# Patient Record
Sex: Female | Born: 1965 | Race: Black or African American | Hispanic: No | Marital: Single | Smoking: Never smoker
Health system: Southern US, Community
[De-identification: ages and names within clinical notes are randomized; demographics above are authoritative.]

## PROBLEM LIST (undated history)

## (undated) DIAGNOSIS — M1990 Unspecified osteoarthritis, unspecified site: Secondary | ICD-10-CM

## (undated) DIAGNOSIS — E039 Hypothyroidism, unspecified: Secondary | ICD-10-CM

## (undated) DIAGNOSIS — K219 Gastro-esophageal reflux disease without esophagitis: Secondary | ICD-10-CM

## (undated) DIAGNOSIS — J45909 Unspecified asthma, uncomplicated: Secondary | ICD-10-CM

## (undated) DIAGNOSIS — I10 Essential (primary) hypertension: Secondary | ICD-10-CM

## (undated) DIAGNOSIS — F32A Depression, unspecified: Secondary | ICD-10-CM

## (undated) DIAGNOSIS — F419 Anxiety disorder, unspecified: Secondary | ICD-10-CM

## (undated) HISTORY — PX: WRIST SURGERY: SHX841

## (undated) HISTORY — PX: KNEE ARTHROPLASTY: SHX992

## (undated) HISTORY — PX: HERNIA REPAIR: SHX51

## (undated) HISTORY — PX: WISDOM TOOTH EXTRACTION: SHX21

---

## 1998-11-05 ENCOUNTER — Inpatient Hospital Stay (HOSPITAL_COMMUNITY): Admission: AD | Admit: 1998-11-05 | Discharge: 1998-11-05 | Payer: Self-pay | Admitting: Obstetrics

## 1998-11-16 ENCOUNTER — Encounter (HOSPITAL_COMMUNITY): Admission: RE | Admit: 1998-11-16 | Discharge: 1998-11-26 | Payer: Self-pay | Admitting: Obstetrics

## 1998-11-22 ENCOUNTER — Encounter: Payer: Self-pay | Admitting: Obstetrics

## 1998-11-24 ENCOUNTER — Inpatient Hospital Stay (HOSPITAL_COMMUNITY): Admission: AD | Admit: 1998-11-24 | Discharge: 1998-11-27 | Payer: Self-pay | Admitting: Obstetrics

## 1998-11-25 ENCOUNTER — Encounter: Payer: Self-pay | Admitting: Obstetrics

## 2010-01-21 ENCOUNTER — Inpatient Hospital Stay (HOSPITAL_COMMUNITY): Admission: RE | Admit: 2010-01-21 | Discharge: 2010-01-24 | Payer: Self-pay | Admitting: Orthopedic Surgery

## 2010-01-27 ENCOUNTER — Inpatient Hospital Stay (HOSPITAL_COMMUNITY): Admission: EM | Admit: 2010-01-27 | Discharge: 2010-01-28 | Payer: Self-pay | Admitting: Internal Medicine

## 2010-01-28 IMAGING — CT CT EXTREM LOW W/O CM*R*
3 series · 16 of 33 positions shown, 19 images · non-contrast
Comparison: None.

CLINICAL DATA: Status post knee replacement [DATE].  Knee pain
and bruising.  Question hemarthrosis.

CT RIGHT KNEE WITHOUT CONTRAST
TECHNIQUE: Multidetector CT imaging of the right knee was
performed according to the standard protocol without intravenous
contrast. Multiplanar CT image reconstructions were also generated.

[Series 5: rt knee 3.0 b40s · axial · 0.40mm/px · z∈[-344,-164]mm · 8 of 72 slices shown, 10 images]
[im 6/72  soft-tissue]
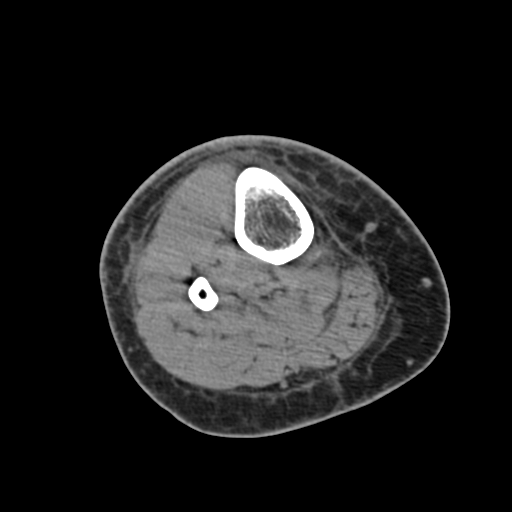
[im 6/72  bone]
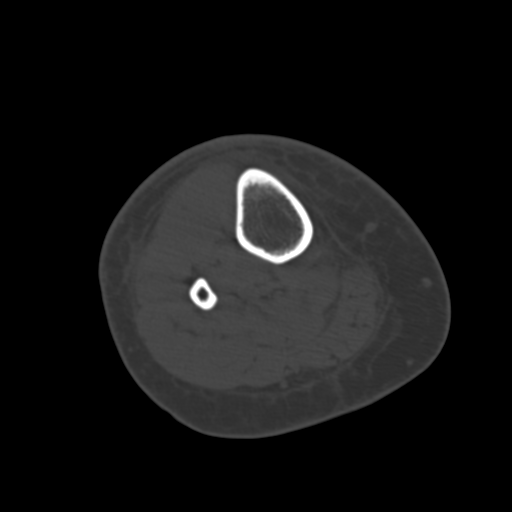
[im 17/72  bone]
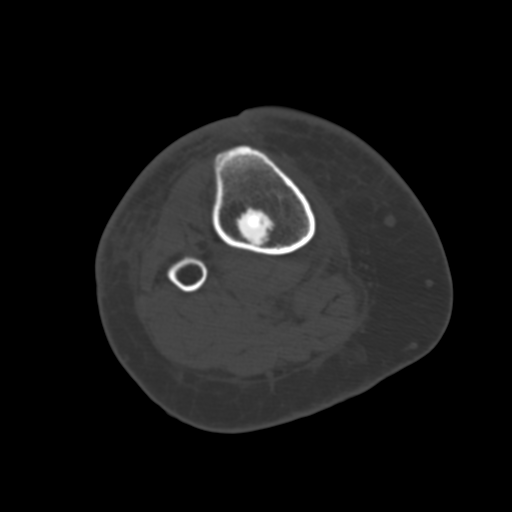
[im 22/72  bone]
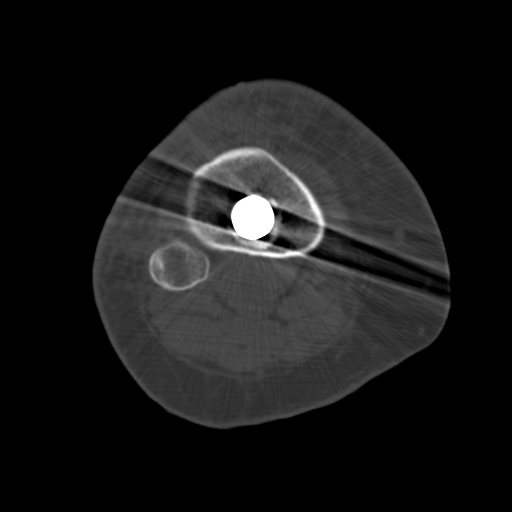
[im 33/72  bone]
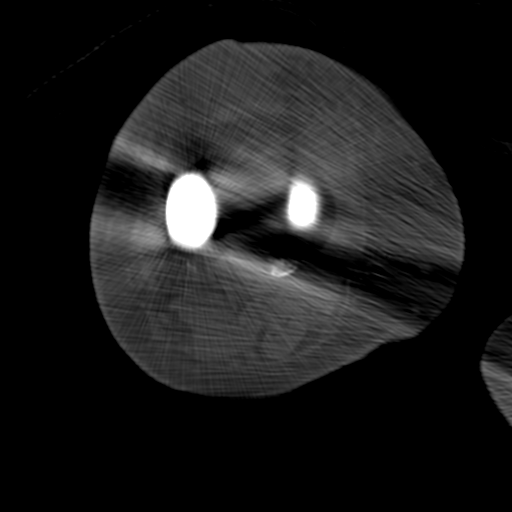
[im 39/72  soft-tissue]
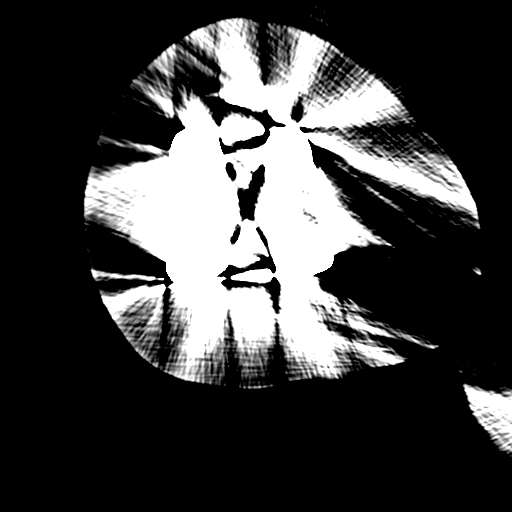
[im 39/72  bone]
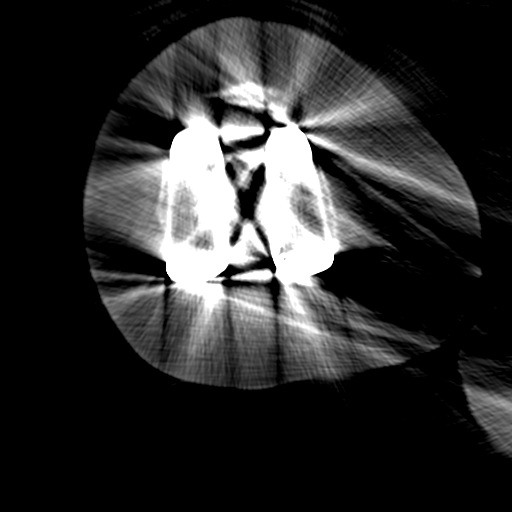
[im 50/72  bone]
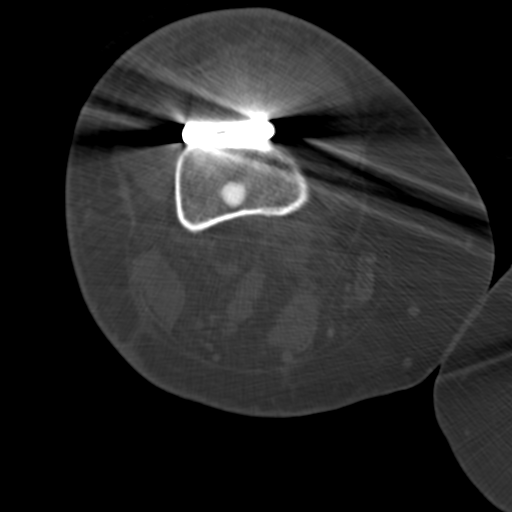
[im 55/72  bone]
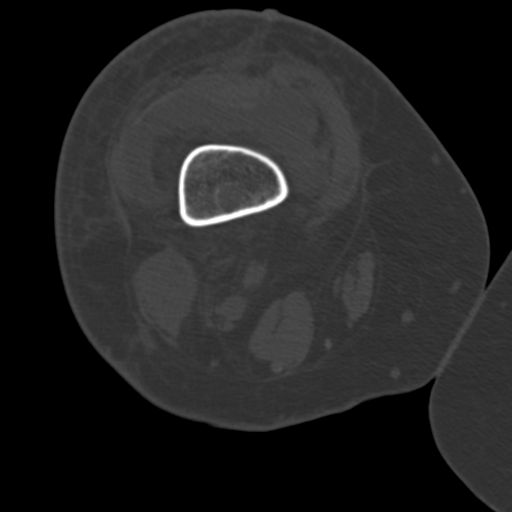
[im 66/72  bone]
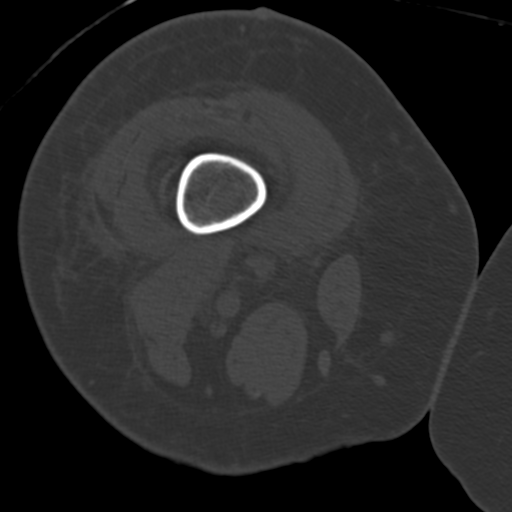

[Series 602: coronal soft tissue · coronal · 0.42mm/px · 3 of 79 slices shown]
[im 16/79  bone]
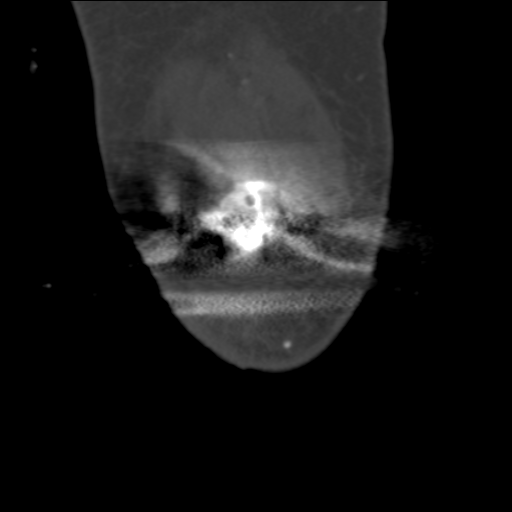
[im 32/79  bone]
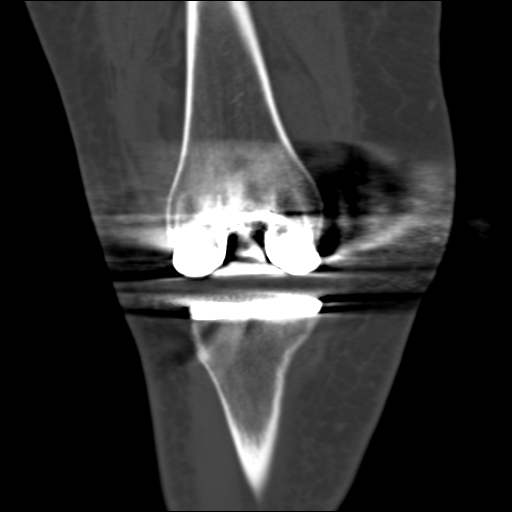
[im 47/79  bone]
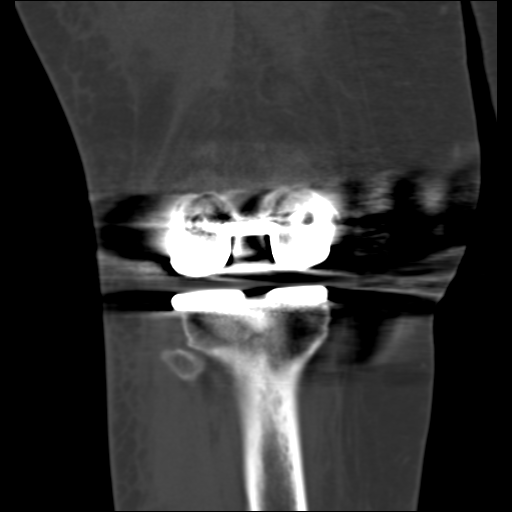

[Series 603: sagittal soft tissue · sagittal · 0.42mm/px · 5 of 94 slices shown, 6 images]
[im 32/94  bone]
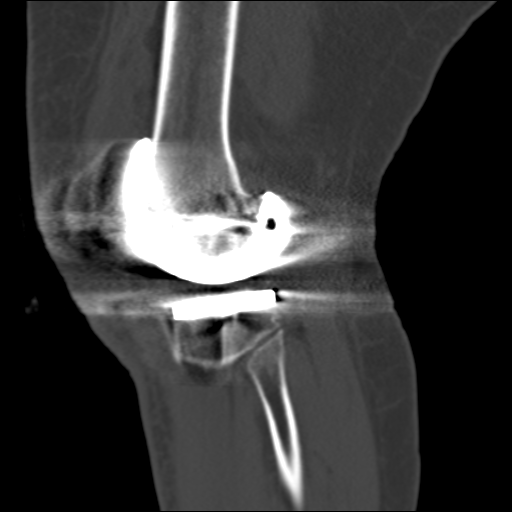
[im 39/94  bone]
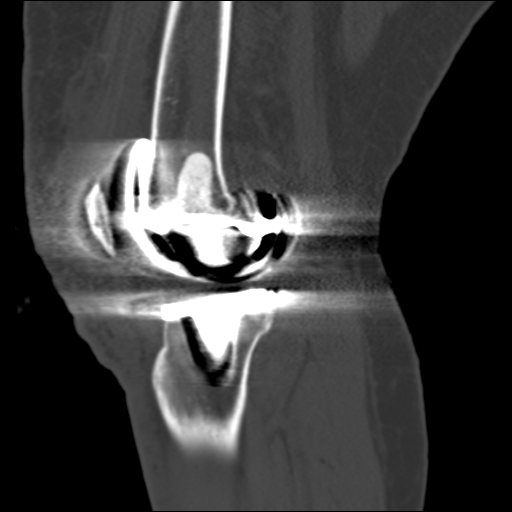
[im 47/94  soft-tissue]
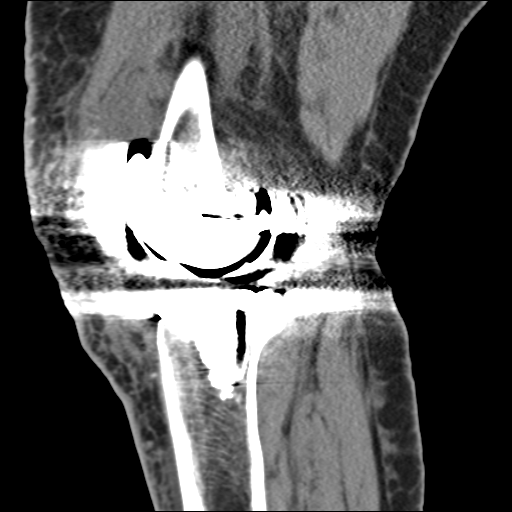
[im 47/94  bone]
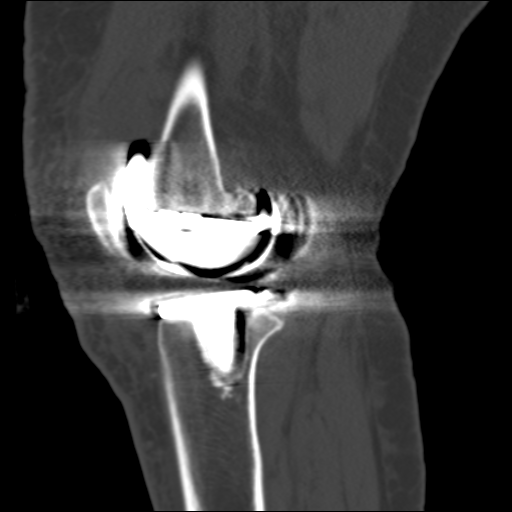
[im 55/94  bone]
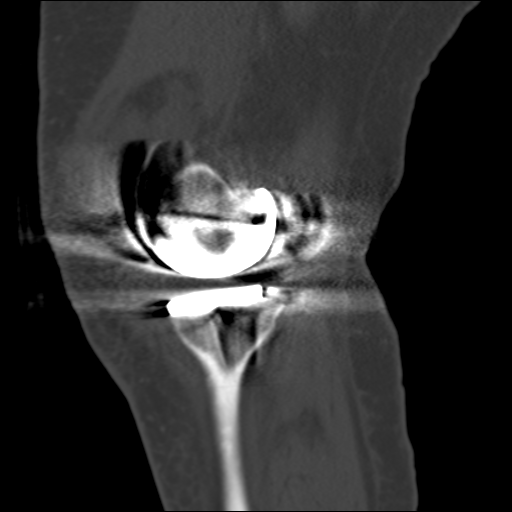
[im 63/94  bone]
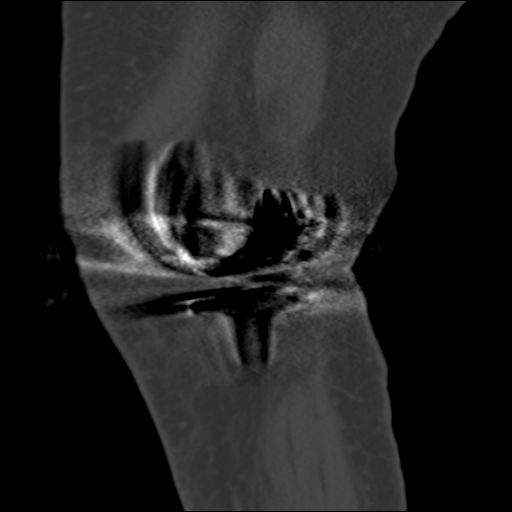

[16 of 33 positions shown; findings below may reference images not displayed]

FINDINGS: There is extensive streak artifact about the knee from
the patient's total knee replacement device.  The patient does have
a joint effusion which is incompletely visualized.  There is no
evidence of hemarthrosis on images provided.  No fracture is
present.  The device is located.  Musculature about the knee
appears normal.
IMPRESSION: Limited study demonstrating no evidence of hemarthrosis.  Joint
effusion noted.

## 2010-12-02 LAB — COMPREHENSIVE METABOLIC PANEL
ALT: 11 U/L (ref 0–35)
AST: 25 U/L (ref 0–37)
Albumin: 2.6 g/dL — ABNORMAL LOW (ref 3.5–5.2)
Alkaline Phosphatase: 74 U/L (ref 39–117)
BUN: 4 mg/dL — ABNORMAL LOW (ref 6–23)
CO2: 25 mEq/L (ref 19–32)
Calcium: 8.5 mg/dL (ref 8.4–10.5)
Chloride: 108 mEq/L (ref 96–112)
Creatinine, Ser: 0.74 mg/dL (ref 0.4–1.2)
GFR calc Af Amer: >60 mL/min (ref >60)
GFR calc non Af Amer: >60 mL/min (ref >60)
Glucose, Bld: 174 mg/dL — ABNORMAL HIGH (ref 70–99)
Potassium: 3.5 mEq/L (ref 3.5–5.1)
Sodium: 139 mEq/L (ref 135–145)
Total Bilirubin: 0.2 mg/dL — ABNORMAL LOW (ref 0.3–1.2)
Total Protein: 6 g/dL (ref 6.0–8.3)

## 2010-12-02 LAB — BASIC METABOLIC PANEL
CO2: 27 mEq/L (ref 19–32)
Calcium: 8.7 mg/dL (ref 8.4–10.5)
GFR calc Af Amer: >60 mL/min (ref >60)
Sodium: 139 mEq/L (ref 135–145)

## 2010-12-02 LAB — CBC
HCT: 28.1 % — ABNORMAL LOW (ref 36.0–46.0)
Hemoglobin: 9.2 g/dL — ABNORMAL LOW (ref 12.0–15.0)
Hemoglobin: 9.7 g/dL — ABNORMAL LOW (ref 12.0–15.0)
MCHC: 32.6 g/dL (ref 30.0–36.0)
MCHC: 32.8 g/dL (ref 30.0–36.0)
MCV: 88.9 fL (ref 78.0–100.0)
Platelets: 375 10*3/uL (ref 150–400)
RBC: 3.16 MIL/uL — ABNORMAL LOW (ref 3.87–5.11)
RBC: 3.33 MIL/uL — ABNORMAL LOW (ref 3.87–5.11)
RDW: 13.4 % (ref 11.5–15.5)
WBC: 9 10*3/uL (ref 4.0–10.5)

## 2010-12-02 LAB — DIFFERENTIAL
Basophils Absolute: 0 10*3/uL (ref 0.0–0.1)
Basophils Relative: 0 % (ref 0–1)
Eosinophils Absolute: 0.1 10*3/uL (ref 0.0–0.7)
Eosinophils Relative: 1 % (ref 0–5)
Lymphs Abs: 3 10*3/uL (ref 0.7–4.0)

## 2010-12-02 LAB — PROTIME-INR
INR: 2 — ABNORMAL HIGH (ref 0.00–1.49)
Prothrombin Time: 30.8 seconds — ABNORMAL HIGH (ref 11.6–15.2)
Prothrombin Time: 72.7 seconds — ABNORMAL HIGH (ref 11.6–15.2)

## 2010-12-03 LAB — ABO/RH: ABO/RH(D): B POS

## 2010-12-03 LAB — CBC
HCT: 29.3 % — ABNORMAL LOW (ref 36.0–46.0)
HCT: 30.7 % — ABNORMAL LOW (ref 36.0–46.0)
Hemoglobin: 10.1 g/dL — ABNORMAL LOW (ref 12.0–15.0)
Hemoglobin: 9.6 g/dL — ABNORMAL LOW (ref 12.0–15.0)
MCHC: 32.7 g/dL (ref 30.0–36.0)
MCHC: 32.9 g/dL (ref 30.0–36.0)
MCV: 89.2 fL (ref 78.0–100.0)
MCV: 89.8 fL (ref 78.0–100.0)
Platelets: 290 10*3/uL (ref 150–400)
Platelets: 335 10*3/uL (ref 150–400)
Platelets: 406 10*3/uL — ABNORMAL HIGH (ref 150–400)
RBC: 3.44 MIL/uL — ABNORMAL LOW (ref 3.87–5.11)
RBC: 3.75 MIL/uL — ABNORMAL LOW (ref 3.87–5.11)
RDW: 13.6 % (ref 11.5–15.5)
RDW: 13.9 % (ref 11.5–15.5)
WBC: 8.7 10*3/uL (ref 4.0–10.5)

## 2010-12-03 LAB — PROTIME-INR
INR: 1.06 (ref 0.00–1.49)
Prothrombin Time: 13.7 seconds (ref 11.6–15.2)

## 2010-12-03 LAB — BASIC METABOLIC PANEL
BUN: 7 mg/dL (ref 6–23)
CO2: 23 mEq/L (ref 19–32)
CO2: 25 mEq/L (ref 19–32)
Calcium: 8.7 mg/dL (ref 8.4–10.5)
Chloride: 105 mEq/L (ref 96–112)
Creatinine, Ser: 0.83 mg/dL (ref 0.4–1.2)
GFR calc Af Amer: >60 mL/min (ref >60)
GFR calc Af Amer: >60 mL/min (ref >60)
Glucose, Bld: 137 mg/dL — ABNORMAL HIGH (ref 70–99)
Potassium: 3.7 mEq/L (ref 3.5–5.1)
Sodium: 134 mEq/L — ABNORMAL LOW (ref 135–145)

## 2010-12-03 LAB — URINALYSIS, ROUTINE W REFLEX MICROSCOPIC
Bilirubin Urine: NEGATIVE
Bilirubin Urine: NEGATIVE
Ketones, ur: NEGATIVE mg/dL
Leukocytes, UA: NEGATIVE
Nitrite: NEGATIVE
Nitrite: NEGATIVE
Protein, ur: NEGATIVE mg/dL
Specific Gravity, Urine: 1.019 (ref 1.005–1.030)
Specific Gravity, Urine: 1.02 (ref 1.005–1.030)
Urobilinogen, UA: 1 mg/dL (ref 0.0–1.0)
pH: 7.5 (ref 5.0–8.0)

## 2010-12-03 LAB — COMPREHENSIVE METABOLIC PANEL
AST: 16 U/L (ref 0–37)
Albumin: 3.6 g/dL (ref 3.5–5.2)
Alkaline Phosphatase: 90 U/L (ref 39–117)
Chloride: 109 mEq/L (ref 96–112)
GFR calc Af Amer: >60 mL/min (ref >60)
Potassium: 4.1 mEq/L (ref 3.5–5.1)
Total Bilirubin: 0.4 mg/dL (ref 0.3–1.2)

## 2010-12-03 LAB — TYPE AND SCREEN: Antibody Screen: NEGATIVE

## 2010-12-03 LAB — URINE MICROSCOPIC-ADD ON

## 2010-12-03 LAB — APTT: aPTT: 26 seconds (ref 24–37)

## 2014-04-21 ENCOUNTER — Other Ambulatory Visit: Payer: Self-pay | Admitting: Nurse Practitioner

## 2014-04-21 ENCOUNTER — Other Ambulatory Visit: Payer: Self-pay | Admitting: *Deleted

## 2014-04-21 DIAGNOSIS — Z1231 Encounter for screening mammogram for malignant neoplasm of breast: Secondary | ICD-10-CM

## 2014-04-28 ENCOUNTER — Ambulatory Visit: Payer: Self-pay

## 2014-10-16 DIAGNOSIS — M1990 Unspecified osteoarthritis, unspecified site: Secondary | ICD-10-CM | POA: Diagnosis not present

## 2014-10-17 DIAGNOSIS — M1990 Unspecified osteoarthritis, unspecified site: Secondary | ICD-10-CM | POA: Diagnosis not present

## 2014-10-18 DIAGNOSIS — M1990 Unspecified osteoarthritis, unspecified site: Secondary | ICD-10-CM | POA: Diagnosis not present

## 2014-10-19 DIAGNOSIS — M1990 Unspecified osteoarthritis, unspecified site: Secondary | ICD-10-CM | POA: Diagnosis not present

## 2014-10-20 DIAGNOSIS — Z96653 Presence of artificial knee joint, bilateral: Secondary | ICD-10-CM | POA: Diagnosis not present

## 2014-10-20 DIAGNOSIS — M5136 Other intervertebral disc degeneration, lumbar region: Secondary | ICD-10-CM | POA: Diagnosis not present

## 2014-10-20 DIAGNOSIS — E663 Overweight: Secondary | ICD-10-CM | POA: Diagnosis not present

## 2014-10-20 DIAGNOSIS — K5909 Other constipation: Secondary | ICD-10-CM | POA: Diagnosis not present

## 2014-10-20 DIAGNOSIS — G894 Chronic pain syndrome: Secondary | ICD-10-CM | POA: Diagnosis not present

## 2014-10-20 DIAGNOSIS — M5412 Radiculopathy, cervical region: Secondary | ICD-10-CM | POA: Diagnosis not present

## 2014-10-20 DIAGNOSIS — M5416 Radiculopathy, lumbar region: Secondary | ICD-10-CM | POA: Diagnosis not present

## 2014-10-20 DIAGNOSIS — M1990 Unspecified osteoarthritis, unspecified site: Secondary | ICD-10-CM | POA: Diagnosis not present

## 2014-10-21 DIAGNOSIS — M1990 Unspecified osteoarthritis, unspecified site: Secondary | ICD-10-CM | POA: Diagnosis not present

## 2014-10-22 DIAGNOSIS — M1990 Unspecified osteoarthritis, unspecified site: Secondary | ICD-10-CM | POA: Diagnosis not present

## 2014-10-23 DIAGNOSIS — M1990 Unspecified osteoarthritis, unspecified site: Secondary | ICD-10-CM | POA: Diagnosis not present

## 2014-10-24 DIAGNOSIS — M1990 Unspecified osteoarthritis, unspecified site: Secondary | ICD-10-CM | POA: Diagnosis not present

## 2014-10-25 DIAGNOSIS — M1990 Unspecified osteoarthritis, unspecified site: Secondary | ICD-10-CM | POA: Diagnosis not present

## 2014-10-26 DIAGNOSIS — M1990 Unspecified osteoarthritis, unspecified site: Secondary | ICD-10-CM | POA: Diagnosis not present

## 2014-10-27 DIAGNOSIS — M1990 Unspecified osteoarthritis, unspecified site: Secondary | ICD-10-CM | POA: Diagnosis not present

## 2014-10-28 DIAGNOSIS — M1990 Unspecified osteoarthritis, unspecified site: Secondary | ICD-10-CM | POA: Diagnosis not present

## 2014-10-29 DIAGNOSIS — M1990 Unspecified osteoarthritis, unspecified site: Secondary | ICD-10-CM | POA: Diagnosis not present

## 2014-10-30 DIAGNOSIS — M1990 Unspecified osteoarthritis, unspecified site: Secondary | ICD-10-CM | POA: Diagnosis not present

## 2014-10-31 DIAGNOSIS — M1990 Unspecified osteoarthritis, unspecified site: Secondary | ICD-10-CM | POA: Diagnosis not present

## 2014-11-01 DIAGNOSIS — M1990 Unspecified osteoarthritis, unspecified site: Secondary | ICD-10-CM | POA: Diagnosis not present

## 2014-11-02 DIAGNOSIS — M1990 Unspecified osteoarthritis, unspecified site: Secondary | ICD-10-CM | POA: Diagnosis not present

## 2014-11-03 DIAGNOSIS — Z5181 Encounter for therapeutic drug level monitoring: Secondary | ICD-10-CM | POA: Diagnosis not present

## 2014-11-03 DIAGNOSIS — Z79899 Other long term (current) drug therapy: Secondary | ICD-10-CM | POA: Diagnosis not present

## 2014-11-03 DIAGNOSIS — M1990 Unspecified osteoarthritis, unspecified site: Secondary | ICD-10-CM | POA: Diagnosis not present

## 2014-11-04 DIAGNOSIS — M1990 Unspecified osteoarthritis, unspecified site: Secondary | ICD-10-CM | POA: Diagnosis not present

## 2014-11-05 DIAGNOSIS — M1990 Unspecified osteoarthritis, unspecified site: Secondary | ICD-10-CM | POA: Diagnosis not present

## 2014-11-06 DIAGNOSIS — M1990 Unspecified osteoarthritis, unspecified site: Secondary | ICD-10-CM | POA: Diagnosis not present

## 2014-11-07 DIAGNOSIS — M1990 Unspecified osteoarthritis, unspecified site: Secondary | ICD-10-CM | POA: Diagnosis not present

## 2014-11-08 DIAGNOSIS — M1990 Unspecified osteoarthritis, unspecified site: Secondary | ICD-10-CM | POA: Diagnosis not present

## 2014-11-09 DIAGNOSIS — M1990 Unspecified osteoarthritis, unspecified site: Secondary | ICD-10-CM | POA: Diagnosis not present

## 2014-11-10 DIAGNOSIS — M1990 Unspecified osteoarthritis, unspecified site: Secondary | ICD-10-CM | POA: Diagnosis not present

## 2014-11-11 DIAGNOSIS — M1990 Unspecified osteoarthritis, unspecified site: Secondary | ICD-10-CM | POA: Diagnosis not present

## 2014-11-12 DIAGNOSIS — M1990 Unspecified osteoarthritis, unspecified site: Secondary | ICD-10-CM | POA: Diagnosis not present

## 2014-11-13 DIAGNOSIS — J329 Chronic sinusitis, unspecified: Secondary | ICD-10-CM | POA: Diagnosis not present

## 2014-11-13 DIAGNOSIS — G4700 Insomnia, unspecified: Secondary | ICD-10-CM | POA: Diagnosis not present

## 2014-11-13 DIAGNOSIS — I10 Essential (primary) hypertension: Secondary | ICD-10-CM | POA: Diagnosis not present

## 2014-11-13 DIAGNOSIS — M79609 Pain in unspecified limb: Secondary | ICD-10-CM | POA: Diagnosis not present

## 2014-11-13 DIAGNOSIS — M1990 Unspecified osteoarthritis, unspecified site: Secondary | ICD-10-CM | POA: Diagnosis not present

## 2020-02-16 ENCOUNTER — Ambulatory Visit: Payer: Self-pay | Admitting: Family Medicine

## 2020-02-16 ENCOUNTER — Ambulatory Visit: Payer: Self-pay | Admitting: Surgery

## 2020-02-20 ENCOUNTER — Ambulatory Visit: Payer: Medicare Other | Admitting: Family Medicine

## 2020-07-23 ENCOUNTER — Ambulatory Visit (INDEPENDENT_AMBULATORY_CARE_PROVIDER_SITE_OTHER): Payer: Medicare HMO | Admitting: Podiatry

## 2020-07-23 DIAGNOSIS — Z5329 Procedure and treatment not carried out because of patient's decision for other reasons: Secondary | ICD-10-CM

## 2020-08-20 ENCOUNTER — Other Ambulatory Visit: Payer: Self-pay | Admitting: Podiatry

## 2020-08-20 ENCOUNTER — Other Ambulatory Visit: Payer: Self-pay | Admitting: *Deleted

## 2020-08-20 ENCOUNTER — Ambulatory Visit (INDEPENDENT_AMBULATORY_CARE_PROVIDER_SITE_OTHER): Payer: Medicare HMO

## 2020-08-20 ENCOUNTER — Other Ambulatory Visit: Payer: Self-pay

## 2020-08-20 ENCOUNTER — Ambulatory Visit (INDEPENDENT_AMBULATORY_CARE_PROVIDER_SITE_OTHER): Payer: Medicare HMO | Admitting: Podiatry

## 2020-08-20 ENCOUNTER — Encounter: Payer: Self-pay | Admitting: Podiatry

## 2020-08-20 DIAGNOSIS — M21612 Bunion of left foot: Secondary | ICD-10-CM

## 2020-08-20 DIAGNOSIS — M2012 Hallux valgus (acquired), left foot: Secondary | ICD-10-CM

## 2020-08-20 DIAGNOSIS — M779 Enthesopathy, unspecified: Secondary | ICD-10-CM

## 2020-08-20 DIAGNOSIS — M79675 Pain in left toe(s): Secondary | ICD-10-CM

## 2020-08-20 DIAGNOSIS — M2042 Other hammer toe(s) (acquired), left foot: Secondary | ICD-10-CM | POA: Diagnosis not present

## 2020-08-20 DIAGNOSIS — M21962 Unspecified acquired deformity of left lower leg: Secondary | ICD-10-CM

## 2020-08-20 NOTE — Progress Notes (Signed)
  Subjective:  Patient ID: Brooke Ward, female    DOB: 09/03/1966,  MRN: [id number]  Chief Complaint  Patient presents with  . Callouses    there is a hard spot on the 2nd toe left   . Foot Problem    i have t wo scabs on both feet near the bend where the ankle starts    54 y.o. female presents with the above complaint. History confirmed with patient. States that the lleft 2nd toe hurts and has a lot of hard skin. Reports shooting pain, soreness, tenderness.  Objective:  Physical Exam: warm, good capillary refill, no trophic changes or ulcerative lesions, normal DP and PT pulses and normal sensory exam. Left Foot: Moderate Hallux valgus, mallet toe left 2nd toe with POP distal hyperkeratosis, lesser digital contractures 3-5 without POP  Abrasions anterior ankle bilat  No images are attached to the encounter.  Radiographs: X-ray of the left foot: no fracture, dislocation, swelling or degenerative changes noted, hallux valgus deformity and digital contractures Assessment:   1. Hallux valgus with bunions, left   2. Hammertoe of left foot   3. Pain in toe of left foot      Plan:  Patient was evaluated and treated and all questions answered.  Bunion and Hammertoe -XR reviewed with patient -Educated on etiology of deformity -Discussed proper shoe gear modifications and padding  -Patient has failed all conservative therapy and wishes to proceed with surgical intervention. All risks, benefits, and alternatives discussed with patient. No guarantees given. Consent reviewed and signed by patient. -Planned procedures: left foot correction of bunion with cutting and shifting of bone, left 2nd hammertoe correction with pin or screw fixatoin. -DME dispensed for post-op use: [facility]   Return for post-op care.

## 2020-09-19 ENCOUNTER — Telehealth: Payer: Self-pay

## 2020-09-19 NOTE — Telephone Encounter (Signed)
DOS 09/26/2020  [place] BUNIONECTOMY LT - 28296 HAMMERTOE REPAIR 2ND LT - 28285  [employer] MEDICARE EFFECTIVE DATE - 09/15/2020  PLAN DEDUCTIBLE - $0.00 OUT OF POCKET - $7550.00 W/ $7550.00 REMAINING  CO-INSURANCE 20% / Day OUTPATIENT SURGERY 20% / Day OUTPATIENT HOSPITAL COPAY $0 / Day OUTPATIENT SURGERY $0 / Day OUTPATIENT HOSPITAL  Notification or Prior Authorization is not required for the requested services  This [employer] Medicare Advantage members plan does not currently require a prior authorization for these services. If you have general questions about the prior authorization requirements, please call us at [phone] or visit [email or web address] > Clinician Resources > Advance and Admission Notification Requirements. The number above acknowledges your notification. Please write this number down for future reference. Notification is not a guarantee of coverage or payment.  Decision ID #:[id number]

## 2020-09-25 ENCOUNTER — Telehealth: Payer: Self-pay

## 2020-09-25 NOTE — Telephone Encounter (Signed)
Received an email from [facility] at [facility]. Brooke Ward called them and stated she wanted to reschedule her surgery with Dr. Samuella Cota on 09/26/2020 and wait till summer. I called and left a message for her to call me and we can get her surgery rescheduled.

## 2020-10-01 ENCOUNTER — Encounter: Payer: Medicare HMO | Admitting: Podiatry

## 2020-10-08 ENCOUNTER — Encounter: Payer: Medicare HMO | Admitting: Podiatry

## 2020-10-22 ENCOUNTER — Encounter: Payer: Medicare HMO | Admitting: Podiatry

## 2021-04-04 ENCOUNTER — Ambulatory Visit (INDEPENDENT_AMBULATORY_CARE_PROVIDER_SITE_OTHER): Payer: Medicare HMO | Admitting: Podiatry

## 2021-04-04 ENCOUNTER — Ambulatory Visit (INDEPENDENT_AMBULATORY_CARE_PROVIDER_SITE_OTHER): Payer: Medicare HMO

## 2021-04-04 ENCOUNTER — Encounter: Payer: Self-pay | Admitting: Podiatry

## 2021-04-04 ENCOUNTER — Other Ambulatory Visit: Payer: Self-pay

## 2021-04-04 DIAGNOSIS — M7750 Other enthesopathy of unspecified foot: Secondary | ICD-10-CM

## 2021-04-04 DIAGNOSIS — L84 Corns and callosities: Secondary | ICD-10-CM | POA: Diagnosis not present

## 2021-04-04 DIAGNOSIS — I872 Venous insufficiency (chronic) (peripheral): Secondary | ICD-10-CM

## 2021-04-04 DIAGNOSIS — M7752 Other enthesopathy of left foot: Secondary | ICD-10-CM

## 2021-04-04 NOTE — Progress Notes (Signed)
  Subjective:  Patient ID: Brooke Ward, female    DOB: 1965-10-27,  MRN: [id number]  Chief Complaint  Patient presents with   Foot Pain    The left ankle is swelling and the left leg is pink color and it helped last week   55 y.o. female presents with the above complaint. Reports pain at the right 4th/5th toes. Also complains of pain at the left leg with associated swelling.  Objective:  Physical Exam: warm, good capillary refill, no trophic changes or ulcerative lesions, normal DP and PT pulses and normal sensory exam. Left Foot: Moderate Hallux valgus, mallet toe left 2nd toe with POP distal hyperkeratosis, lesser digital contractures 3-5 without POP  Pitting edema left leg, mild edema right ankle. Right 4th interspace interdigital corn.  Assessment:   1. Tendonitis of ankle   2. Corn of foot   3. Venous (peripheral) insufficiency     Plan:  Patient was evaluated and treated and all questions answered.  Bunion and Hammertoe -Not interested in surgery discussed last visit left foot -Corn debrided right 4th interspace  Procedure: Paring of Lesion Rationale: painful hyperkeratotic lesion Type of Debridement: manual, sharp debridement. Instrumentation: 312 blade Number of Lesions: 1  Venous insufficiency, fluid overload -Xrs without osseous injury, midfoot degenerative changes noted. -Educated on etiology -Recc discuss with PCP diuretics -Dispense tubigrip for reduction of edema.  No follow-ups on file.

## 2021-08-21 ENCOUNTER — Other Ambulatory Visit (HOSPITAL_COMMUNITY): Payer: Self-pay | Admitting: Physician Assistant

## 2021-08-21 ENCOUNTER — Other Ambulatory Visit: Payer: Self-pay | Admitting: Physician Assistant

## 2021-08-21 DIAGNOSIS — M25562 Pain in left knee: Secondary | ICD-10-CM

## 2021-09-04 ENCOUNTER — Encounter (HOSPITAL_COMMUNITY): Payer: Medicare HMO | Attending: Physician Assistant

## 2021-09-04 ENCOUNTER — Encounter (HOSPITAL_COMMUNITY): Payer: Self-pay

## 2021-09-04 ENCOUNTER — Encounter (HOSPITAL_COMMUNITY): Admission: RE | Admit: 2021-09-04 | Payer: Medicare HMO | Source: Ambulatory Visit

## 2021-09-19 NOTE — Progress Notes (Signed)
Sent message, via epic in basket, requesting orders in epic from surgeon.  

## 2021-10-03 NOTE — Patient Instructions (Signed)
DUE TO COVID-19 ONLY ONE VISITOR IS ALLOWED TO COME WITH YOU AND STAY IN THE WAITING ROOM ONLY DURING PRE OP AND PROCEDURE.   **NO VISITORS ARE ALLOWED IN THE SHORT STAY AREA OR RECOVERY ROOM!!**  IF YOU WILL BE ADMITTED INTO THE HOSPITAL YOU ARE ALLOWED ONLY TWO SUPPORT PEOPLE DURING VISITATION HOURS ONLY (7 AM -8PM)   The support person(s) must pass our screening, gel in and out, and wear a mask at all times, including in the patients room. Patients must also wear a mask when staff or their support person are in the room. Visitors GUEST BADGE MUST BE WORN VISIBLY  One adult visitor may remain with you overnight and MUST be in the room by 8 P.M.  No visitors under the age of 516. Any visitor under the age of 54 must be accompanied by an adult.        Your procedure is scheduled on: 10/10/21   Report to [facility] Main Entrance    Report to admitting at : 12:15 PM   Call this number if you have problems the morning of surgery [phone]   Do not eat food :After Midnight.   May have liquids until : 12:00 PM   day of surgery  CLEAR LIQUID DIET  Foods Allowed                                                                     Foods Excluded  Water, Black Coffee and tea, regular and decaf                             liquids that you cannot  Plain Jell-O in any flavor  (No red)                                           see through such as: Fruit ices (not with fruit pulp)                                     milk, soups, orange juice              Iced Popsicles (No red)                                    All solid food                                   Apple juices Sports drinks like Gatorade (No red) Lightly seasoned clear broth or consume(fat free) Sugar Sample Menu Breakfast                                Lunch  Supper Cranberry juice                    Beef broth                            Chicken broth Jell-O                                      Grape juice                           Apple juice Coffee or tea                        Jell-O                                      Popsicle                                                Coffee or tea                        Coffee or tea   Complete one Ensure drink the morning of surgery 3 hours prior to scheduled surgery at: 12:00 PM.    The day of surgery:  Drink ONE (1) Pre-Surgery Clear Ensure or G2 at AM the morning of surgery. Drink in one sitting. Do not sip.  This drink was given to you during your hospital  pre-op appointment visit. Nothing else to drink after completing the  Pre-Surgery Clear Ensure or G2.          If you have questions, please contact your surgeons office.  Oral Hygiene is also important to reduce your risk of infection.                                    Remember - BRUSH YOUR TEETH THE MORNING OF SURGERY WITH YOUR REGULAR TOOTHPASTE   Do NOT smoke after Midnight   Take these medicines the morning of surgery with A SIP OF WATER: alprazolam,gabapentin,synthroid.Use inhalers and Flonase as usual.  DO NOT TAKE ANY ORAL DIABETIC MEDICATIONS DAY OF YOUR SURGERY                              You may not have any metal on your body including hair pins, jewelry, and body piercing             Do not wear make-up, lotions, powders, perfumes/cologne, or deodorant  Do not wear nail polish including gel and S&S, artificial/acrylic nails, or any other type of covering on natural nails including finger and toenails. If you have artificial nails, gel coating, etc. that needs to be removed by a nail salon please have this removed prior to surgery or surgery may need to be canceled/ delayed if the surgeon/ anesthesia feels like they are unable to be safely monitored.   Do not shave  48 hours prior to  surgery.   Do not bring valuables to the hospital. [facility] IS NOT             RESPONSIBLE   FOR VALUABLES.   Contacts, dentures or bridgework may not be worn  into surgery.   Bring small overnight bag day of surgery.    Patients discharged on the day of surgery will not be allowed to drive home.  Someone needs to stay with you for the first 24 hours after anesthesia.   Special Instructions: Bring a copy of your healthcare power of attorney and living will documents         the day of surgery if you haven't scanned them before.              Please read over the following fact sheets you were given: IF YOU HAVE QUESTIONS ABOUT YOUR PRE-OP INSTRUCTIONS PLEASE CALL [phone]     [facility] - Preparing for Surgery Before surgery, you can play an important role.  Because skin is not sterile, your skin needs to be as free of germs as possible.  You can reduce the number of germs on your skin by washing with CHG (chlorahexidine gluconate) soap before surgery.  CHG is an antiseptic cleaner which kills germs and bonds with the skin to continue killing germs even after washing. Please DO NOT use if you have an allergy to CHG or antibacterial soaps.  If your skin becomes reddened/irritated stop using the CHG and inform your nurse when you arrive at Short Stay. Do not shave (including legs and underarms) for at least 48 hours prior to the first CHG shower.  You may shave your face/neck. Please follow these instructions carefully:  1.  Shower with CHG Soap the night before surgery and the  morning of Surgery.  2.  If you choose to wash your hair, wash your hair first as usual with your  normal  shampoo.  3.  After you shampoo, rinse your hair and body thoroughly to remove the  shampoo.                           4.  Use CHG as you would any other liquid soap.  You can apply chg directly  to the skin and wash                       Gently with a scrungie or clean washcloth.  5.  Apply the CHG Soap to your body ONLY FROM THE NECK DOWN.   Do not use on face/ open                           Wound or open sores. Avoid contact with eyes, ears mouth and genitals (private  parts).                       Wash face,  Genitals (private parts) with your normal soap.             6.  Wash thoroughly, paying special attention to the area where your surgery  will be performed.  7.  Thoroughly rinse your body with warm water from the neck down.  8.  DO NOT shower/wash with your normal soap after using and rinsing off  the CHG Soap.                9.  Pat yourself dry with a clean towel.            10.  Wear clean pajamas.            11.  Place clean sheets on your bed the night of your first shower and do not  sleep with pets. Day of Surgery : Do not apply any lotions/deodorants the morning of surgery.  Please wear clean clothes to the hospital/surgery center.  FAILURE TO FOLLOW THESE INSTRUCTIONS MAY RESULT IN THE CANCELLATION OF YOUR SURGERY PATIENT SIGNATURE_________________________________  NURSE SIGNATURE__________________________________  ________________________________________________________________________  [facility]- Preparing for Total Shoulder Arthroplasty    Before surgery, you can play an important role. Because skin is not sterile, your skin needs to be as free of germs as possible. You can reduce the number of germs on your skin by using the following products. Benzoyl Peroxide Gel Reduces the number of germs present on the skin Applied twice a day to shoulder area starting two days before surgery    ==================================================================  Please follow these instructions carefully:  BENZOYL PEROXIDE 5% GEL  Please do not use if you have an allergy to benzoyl peroxide.   If your skin becomes reddened/irritated stop using the benzoyl peroxide.  Starting two days before surgery, apply as follows: Apply benzoyl peroxide in the morning and at night. Apply after taking a shower. If you are not taking a shower clean entire shoulder front, back, and side along with the armpit with a clean wet washcloth.  Place a  quarter-sized dollop on your shoulder and rub in thoroughly, making sure to cover the front, back, and side of your shoulder, along with the armpit.   2 days before ____ AM   ____ PM              1 day before ____ AM   ____ PM                         Do this twice a day for two days.  (Last application is the night before surgery, AFTER using the CHG soap as described below).  Do NOT apply benzoyl peroxide gel on the day of surgery.  Incentive Spirometer  An incentive spirometer is a tool that can help keep your lungs clear and active. This tool measures how well you are filling your lungs with each breath. Taking long deep breaths may help reverse or decrease the chance of developing breathing (pulmonary) problems (especially infection) following: A long period of time when you are unable to move or be active. BEFORE THE PROCEDURE  If the spirometer includes an indicator to show your best effort, your nurse or respiratory therapist will set it to a desired goal. If possible, sit up straight or lean slightly forward. Try not to slouch. Hold the incentive spirometer in an upright position. INSTRUCTIONS FOR USE  Sit on the edge of your bed if possible, or sit up as far as you can in bed or on a chair. Hold the incentive spirometer in an upright position. Breathe out normally. Place the mouthpiece in your mouth and seal your lips tightly around it. Breathe in slowly and as deeply as possible, raising the piston or the ball toward the top of the column. Hold your breath for 3-5 seconds or for as long as possible. Allow the piston or ball to fall to the bottom of the column. Remove the mouthpiece from your mouth and breathe out normally. Rest  for a few seconds and repeat Steps 1 through 7 at least 10 times every 1-2 hours when you are awake. Take your time and take a few normal breaths between deep breaths. The spirometer may include an indicator to show your best effort. Use the indicator as a  goal to work toward during each repetition. After each set of 10 deep breaths, practice coughing to be sure your lungs are clear. If you have an incision (the cut made at the time of surgery), support your incision when coughing by placing a pillow or rolled up towels firmly against it. Once you are able to get out of bed, walk around indoors and cough well. You may stop using the incentive spirometer when instructed by your caregiver.  RISKS AND COMPLICATIONS Take your time so you do not get dizzy or light-headed. If you are in pain, you may need to take or ask for pain medication before doing incentive spirometry. It is harder to take a deep breath if you are having pain. AFTER USE Rest and breathe slowly and easily. It can be helpful to keep track of a log of your progress. Your caregiver can provide you with a simple table to help with this. If you are using the spirometer at home, follow these instructions: SEEK MEDICAL CARE IF:  You are having difficultly using the spirometer. You have trouble using the spirometer as often as instructed. Your pain medication is not giving enough relief while using the spirometer. You develop fever of 100.5 F (38.1 C) or higher. SEEK IMMEDIATE MEDICAL CARE IF:  You cough up bloody sputum that had not been present before. You develop fever of 102 F (38.9 C) or greater. You develop worsening pain at or near the incision site. MAKE SURE YOU:  Understand these instructions. Will watch your condition. Will get help right away if you are not doing well or get worse. Document Released: 01/12/2007 Document Revised: 11/24/2011 Document Reviewed: 03/15/2007 [facility] Patient Information 2014 [place], [place].   ________________________________________________________________________

## 2021-10-04 ENCOUNTER — Encounter (HOSPITAL_COMMUNITY)
Admission: RE | Admit: 2021-10-04 | Discharge: 2021-10-04 | Disposition: A | Discharge status: Home / Self Care | Payer: Medicare HMO | Source: Ambulatory Visit | Attending: Anesthesiology | Admitting: Anesthesiology

## 2021-10-04 ENCOUNTER — Encounter (HOSPITAL_COMMUNITY): Admission: RE | Admit: 2021-10-04 | Payer: Medicare HMO | Source: Ambulatory Visit

## 2021-10-04 ENCOUNTER — Encounter (HOSPITAL_COMMUNITY): Payer: Self-pay

## 2021-10-04 ENCOUNTER — Encounter (HOSPITAL_COMMUNITY): Payer: Medicare HMO

## 2021-10-04 DIAGNOSIS — Z01818 Encounter for other preprocedural examination: Secondary | ICD-10-CM

## 2021-10-04 DIAGNOSIS — I2510 Atherosclerotic heart disease of native coronary artery without angina pectoris: Secondary | ICD-10-CM

## 2021-10-09 NOTE — Progress Notes (Signed)
Attempted to obtain medical history via telephone, unable to reach at this time. I left a voicemail to return pre surgical testing department's phone call.  

## 2021-10-15 ENCOUNTER — Encounter (HOSPITAL_COMMUNITY): Payer: Self-pay

## 2021-10-15 ENCOUNTER — Encounter (HOSPITAL_COMMUNITY): Payer: Medicare HMO

## 2021-10-25 ENCOUNTER — Encounter (HOSPITAL_COMMUNITY)
Admission: RE | Admit: 2021-10-25 | Discharge: 2021-10-25 | Disposition: A | Discharge status: Home / Self Care | Payer: Medicare HMO | Source: Ambulatory Visit | Attending: Physician Assistant | Admitting: Physician Assistant

## 2021-10-25 ENCOUNTER — Other Ambulatory Visit: Payer: Self-pay

## 2021-10-25 DIAGNOSIS — M25562 Pain in left knee: Secondary | ICD-10-CM | POA: Insufficient documentation

## 2021-10-25 MED ORDER — TECHNETIUM TC 99M MEDRONATE IV KIT
20.0000 | PACK | Freq: Once | INTRAVENOUS | Status: AC | PRN
  Administered 2021-10-25: 18.1 via INTRAVENOUS

## 2021-11-14 NOTE — Progress Notes (Addendum)
COVID swab appointment:n/a ? ?COVID Vaccine Completed:no ?Date COVID Vaccine completed: ?Has received booster: ?COVID vaccine manufacturer: [employer]  ? ?Date of COVID positive in last 90 days: no ? ?PCP - York Ram, MD ?Cardiologist - n/a ? ?Chest x-ray - [facility] ?EKG - 11/15/21 Epic ?Stress Test - 6 months ago at [place] ?ECHO - unsure ?Cardiac Cath - n/a ?Pacemaker/ICD device last checked:n/a ?Spinal Cord Stimulator:n/a ? ?Bowel Prep - no ? ?Sleep Study - n/a ?CPAP -  ? ?Fasting Blood Sugar - n/a ?Checks Blood Sugar _____ times a day ? ?Blood Thinner Instructions:n/a ?Aspirin Instructions: ?Last Dose: ? ?Activity level:  Can go up a flight of stairs and perform activities of daily living without stopping and without symptoms of chest pain or shortness of breath. ?   ?Anesthesia review: BP 164/99, denies symptoms. EKG obtained. Per PA, Janett Billow patient needs to contact her PCP and let him know. Informed patient that if her BP is elevated DOS she is at risk for cancellation. Patient verbalized understanding.  ? ?Patient denies shortness of breath, fever, cough and chest pain at PAT appointment ? ? ?Patient verbalized understanding of instructions that were given to them at the PAT appointment. Patient was also instructed that they will need to review over the PAT instructions again at home before surgery.  ?

## 2021-11-14 NOTE — Patient Instructions (Addendum)
DUE TO COVID-19 ONLY ONE VISITOR IS ALLOWED TO COME WITH YOU AND STAY IN THE WAITING ROOM ONLY DURING PRE OP AND PROCEDURE.   **NO VISITORS ARE ALLOWED IN THE SHORT STAY AREA OR RECOVERY ROOM!!**       Your procedure is scheduled on: 11/28/21   Report to [facility] Main Entrance    Report to admitting at 5:15 AM   Call this number if you have problems the morning of surgery [phone]   Do not eat food :After Midnight.   After Midnight you may have the following liquids until 4:30 AM DAY OF SURGERY  Water Black Coffee (sugar ok, NO MILK/CREAM OR CREAMERS)  Tea (sugar ok, NO MILK/CREAM OR CREAMERS) regular and decaf                             Plain Jell-O (NO RED)                                           Fruit ices (not with fruit pulp, NO RED)                                     Popsicles (NO RED)                                                                  Juice: apple, WHITE grape, WHITE cranberry Sports drinks like Gatorade (NO RED) Clear broth(vegetable,chicken,beef)     The day of surgery:  Drink ONE (1) Pre-Surgery Clear Ensure at 4:30 AM the morning of surgery. Drink in one sitting. Do not sip.  This drink was given to you during your hospital  pre-op appointment visit. Nothing else to drink after completing the  Pre-Surgery Clear Ensure.          If you have questions, please contact your surgeons office.   FOLLOW BOWEL PREP AND ANY ADDITIONAL PRE OP INSTRUCTIONS YOU RECEIVED FROM YOUR SURGEON'S OFFICE!!!     Oral Hygiene is also important to reduce your risk of infection.                                    Remember - BRUSH YOUR TEETH THE MORNING OF SURGERY WITH YOUR REGULAR TOOTHPASTE   Take these medicines the morning of surgery with A SIP OF WATER: Inhaler, Xanax, Gabapentin, Levothyroxine, Oxycodone                              You may not have any metal on your body including hair pins, jewelry, and body piercing             Do not wear  make-up, lotions, powders, perfumes, or deodorant  Do not wear nail polish including gel and S&S, artificial/acrylic nails, or any other type of covering on natural nails including finger and toenails. If you have artificial nails, gel coating, etc. that needs  to be removed by a nail salon please have this removed prior to surgery or surgery may need to be canceled/ delayed if the surgeon/ anesthesia feels like they are unable to be safely monitored.   Do not shave  48 hours prior to surgery.    Do not bring valuables to the hospital. [place] IS NOT             RESPONSIBLE   FOR VALUABLES.   Contacts, dentures or bridgework may not be worn into surgery.    Patients discharged on the day of surgery will not be allowed to drive home.  Someone NEEDS to stay with you for the first 24 hours after anesthesia.              Please read over the following fact sheets you were given: IF YOU HAVE QUESTIONS ABOUT YOUR PRE-OP INSTRUCTIONS PLEASE CALL [phone]- [facility] - Preparing for Surgery Before surgery, you can play an important role.  Because skin is not sterile, your skin needs to be as free of germs as possible.  You can reduce the number of germs on your skin by washing with CHG (chlorahexidine gluconate) soap before surgery.  CHG is an antiseptic cleaner which kills germs and bonds with the skin to continue killing germs even after washing. Please DO NOT use if you have an allergy to CHG or antibacterial soaps.  If your skin becomes reddened/irritated stop using the CHG and inform your nurse when you arrive at Short Stay. Do not shave (including legs and underarms) for at least 48 hours prior to the first CHG shower.  You may shave your face/neck.  Please follow these instructions carefully:  1.  Shower with CHG Soap the night before surgery and the  morning of surgery.  2.  If you choose to wash your hair, wash your hair first as usual with your normal  shampoo.  3.  After  you shampoo, rinse your hair and body thoroughly to remove the shampoo.                             4.  Use CHG as you would any other liquid soap.  You can apply chg directly to the skin and wash.  Gently with a scrungie or clean washcloth.  5.  Apply the CHG Soap to your body ONLY FROM THE NECK DOWN.   Do   not use on face/ open                           Wound or open sores. Avoid contact with eyes, ears mouth and   genitals (private parts).                       Wash face,  Genitals (private parts) with your normal soap.             6.  Wash thoroughly, paying special attention to the area where your    surgery  will be performed.  7.  Thoroughly rinse your body with warm water from the neck down.  8.  DO NOT shower/wash with your normal soap after using and rinsing off the CHG Soap.                9.  Pat yourself dry with a clean towel.  10.  Wear clean pajamas.            11.  Place clean sheets on your bed the night of your first shower and do not  sleep with pets. Day of Surgery : Do not apply any lotions/deodorants the morning of surgery.  Please wear clean clothes to the hospital/surgery center.  FAILURE TO FOLLOW THESE INSTRUCTIONS MAY RESULT IN THE CANCELLATION OF YOUR SURGERY  PATIENT SIGNATURE_________________________________  NURSE SIGNATURE__________________________________  ________________________________________________________________________   Rogelia Mire  An incentive spirometer is a tool that can help keep your lungs clear and active. This tool measures how well you are filling your lungs with each breath. Taking long deep breaths may help reverse or decrease the chance of developing breathing (pulmonary) problems (especially infection) following: A long period of time when you are unable to move or be active. BEFORE THE PROCEDURE  If the spirometer includes an indicator to show your best effort, your nurse or respiratory therapist will set it to a  desired goal. If possible, sit up straight or lean slightly forward. Try not to slouch. Hold the incentive spirometer in an upright position. INSTRUCTIONS FOR USE  Sit on the edge of your bed if possible, or sit up as far as you can in bed or on a chair. Hold the incentive spirometer in an upright position. Breathe out normally. Place the mouthpiece in your mouth and seal your lips tightly around it. Breathe in slowly and as deeply as possible, raising the piston or the ball toward the top of the column. Hold your breath for 3-5 seconds or for as long as possible. Allow the piston or ball to fall to the bottom of the column. Remove the mouthpiece from your mouth and breathe out normally. Rest for a few seconds and repeat Steps 1 through 7 at least 10 times every 1-2 hours when you are awake. Take your time and take a few normal breaths between deep breaths. The spirometer may include an indicator to show your best effort. Use the indicator as a goal to work toward during each repetition. After each set of 10 deep breaths, practice coughing to be sure your lungs are clear. If you have an incision (the cut made at the time of surgery), support your incision when coughing by placing a pillow or rolled up towels firmly against it. Once you are able to get out of bed, walk around indoors and cough well. You may stop using the incentive spirometer when instructed by your caregiver.  RISKS AND COMPLICATIONS Take your time so you do not get dizzy or light-headed. If you are in pain, you may need to take or ask for pain medication before doing incentive spirometry. It is harder to take a deep breath if you are having pain. AFTER USE Rest and breathe slowly and easily. It can be helpful to keep track of a log of your progress. Your caregiver can provide you with a simple table to help with this. If you are using the spirometer at home, follow these instructions: SEEK MEDICAL CARE IF:  You are having  difficultly using the spirometer. You have trouble using the spirometer as often as instructed. Your pain medication is not giving enough relief while using the spirometer. You develop fever of 100.5 F (38.1 C) or higher. SEEK IMMEDIATE MEDICAL CARE IF:  You cough up bloody sputum that had not been present before. You develop fever of 102 F (38.9 C) or greater. You develop worsening pain at  or near the incision site. MAKE SURE YOU:  Understand these instructions. Will watch your condition. Will get help right away if you are not doing well or get worse. Document Released: 01/12/2007 Document Revised: 11/24/2011 Document Reviewed: 03/15/2007 Exit[employer] Patient Information 2014 [place] [employer].   ________________________________________________________________________ [facility]- Preparing for Total Shoulder Arthroplasty    Before surgery, you can play an important role. Because skin is not sterile, your skin needs to be as free of germs as possible. You can reduce the number of germs on your skin by using the following products. Benzoyl Peroxide Gel Reduces the number of germs present on the skin Applied twice a day to shoulder area starting two days before surgery    ==================================================================  Please follow these instructions carefully:  BENZOYL PEROXIDE 5% GEL  Please do not use if you have an allergy to benzoyl peroxide.   If your skin becomes reddened/irritated stop using the benzoyl peroxide.  Starting two days before surgery, apply as follows: Apply benzoyl peroxide in the morning and at night. Apply after taking a shower. If you are not taking a shower clean entire shoulder front, back, and side along with the armpit with a clean wet washcloth.  Place a quarter-sized dollop on your shoulder and rub in thoroughly, making sure to cover the front, back, and side of your shoulder, along with the armpit.   2 days before ____ AM   ____  PM              1 day before ____ AM   ____ PM                         Do this twice a day for two days.  (Last application is the night before surgery, AFTER using the CHG soap as described below).  Do NOT apply benzoyl peroxide gel on the day of surgery.

## 2021-11-15 ENCOUNTER — Encounter (HOSPITAL_COMMUNITY): Payer: Self-pay

## 2021-11-15 ENCOUNTER — Encounter (HOSPITAL_COMMUNITY)
Admission: RE | Admit: 2021-11-15 | Discharge: 2021-11-15 | Disposition: A | Discharge status: Home / Self Care | Payer: Medicare HMO | Source: Ambulatory Visit | Attending: Orthopedic Surgery | Admitting: Orthopedic Surgery

## 2021-11-15 ENCOUNTER — Other Ambulatory Visit: Payer: Self-pay

## 2021-11-15 VITALS — BP 164/99 | HR 100 | Temp 98.2°F | Resp 16 | Ht 64.0 in

## 2021-11-15 DIAGNOSIS — Z01818 Encounter for other preprocedural examination: Secondary | ICD-10-CM | POA: Insufficient documentation

## 2021-11-15 DIAGNOSIS — I2510 Atherosclerotic heart disease of native coronary artery without angina pectoris: Secondary | ICD-10-CM | POA: Diagnosis not present

## 2021-11-15 HISTORY — DX: Gastro-esophageal reflux disease without esophagitis: K21.9

## 2021-11-15 HISTORY — DX: Hypothyroidism, unspecified: E03.9

## 2021-11-15 HISTORY — DX: Essential (primary) hypertension: I10

## 2021-11-15 HISTORY — DX: Unspecified osteoarthritis, unspecified site: M19.90

## 2021-11-15 HISTORY — DX: Anxiety disorder, unspecified: F41.9

## 2021-11-15 LAB — CBC
HCT: 40.7 % (ref 36.0–46.0)
Hemoglobin: 13.3 g/dL (ref 12.0–15.0)
MCH: 29.4 pg (ref 26.0–34.0)
MCHC: 32.7 g/dL (ref 30.0–36.0)
MCV: 89.8 fL (ref 80.0–100.0)
Platelets: 343 10*3/uL (ref 150–400)
RBC: 4.53 MIL/uL (ref 3.87–5.11)
RDW: 13.2 % (ref 11.5–15.5)
WBC: 8.9 10*3/uL (ref 4.0–10.5)
nRBC: 0 % (ref 0.0–0.2)

## 2021-11-15 LAB — BASIC METABOLIC PANEL
Anion gap: 9 (ref 5–15)
BUN: 24 mg/dL — ABNORMAL HIGH (ref 6–20)
CO2: 26 mmol/L (ref 22–32)
Calcium: 9.1 mg/dL (ref 8.9–10.3)
Chloride: 101 mmol/L (ref 98–111)
Creatinine, Ser: 0.89 mg/dL (ref 0.44–1.00)
GFR, Estimated: >60 mL/min (ref >60)
Glucose, Bld: 144 mg/dL — ABNORMAL HIGH (ref 70–99)
Potassium: 3.2 mmol/L — ABNORMAL LOW (ref 3.5–5.1)
Sodium: 136 mmol/L (ref 135–145)

## 2021-11-15 LAB — SURGICAL PCR SCREEN
MRSA, PCR: NEGATIVE
Staphylococcus aureus: POSITIVE — AB

## 2021-11-18 ENCOUNTER — Encounter: Payer: Self-pay | Admitting: *Deleted

## 2021-11-18 ENCOUNTER — Ambulatory Visit (INDEPENDENT_AMBULATORY_CARE_PROVIDER_SITE_OTHER): Payer: Medicare HMO | Admitting: Podiatry

## 2021-11-18 ENCOUNTER — Other Ambulatory Visit: Payer: Self-pay | Admitting: *Deleted

## 2021-11-18 DIAGNOSIS — Z91199 Patient's noncompliance with other medical treatment and regimen due to unspecified reason: Secondary | ICD-10-CM

## 2021-11-18 NOTE — Progress Notes (Signed)
[facility] results routed to Dr. Rennis Chris. ?

## 2021-11-18 NOTE — Progress Notes (Signed)
No show

## 2021-11-27 NOTE — Anesthesia Preprocedure Evaluation (Addendum)
Anesthesia Evaluation  ?Patient identified by MRN, date of birth, ID band ?Patient awake ? ? ? ?Reviewed: ?Allergy & Precautions, NPO status , Patient's Chart, lab work & pertinent test results ? ?Airway ?Mallampati: I ? ? ? ? ? ? Dental ? ?(+) Edentulous Upper, Partial Lower, Poor Dentition ?  ?Pulmonary ?neg pulmonary ROS,  ?  ?Pulmonary exam normal ? ? ? ? ? ? ? Cardiovascular ?hypertension, Pt. on medications ?Normal cardiovascular exam ? ? ?  ?Neuro/Psych ?Anxiety negative neurological ROS ?   ? GI/Hepatic ?Neg liver ROS, GERD  Medicated,  ?Endo/Other  ?Hypothyroidism Morbid obesity ? Renal/GU ?negative Renal ROS  ?negative genitourinary ?  ?Musculoskeletal ? ?(+) Arthritis , Osteoarthritis,   ? Abdominal ?(+) + obese,   ?Peds ? Hematology ?negative hematology ROS ?(+)   ?Anesthesia Other Findings ? ? Reproductive/Obstetrics ? ?  ? ? ? ? ? ? ? ? ? ? ? ? ? ?  ?  ? ? ? ? ? ? ? ?Anesthesia Physical ?Anesthesia Plan ? ?ASA: 3 ? ?Anesthesia Plan: General  ? ?Post-op Pain Management: Regional block*  ? ?Induction: Intravenous ? ?PONV Risk Score and Plan: 3 and Ondansetron, Dexamethasone and Midazolam ? ?Airway Management Planned: Oral ETT ? ?Additional Equipment: None ? ?Intra-op Plan:  ? ?Post-operative Plan: Extubation in OR ? ?Informed Consent: I have reviewed the patients History and Physical, chart, labs and discussed the procedure including the risks, benefits and alternatives for the proposed anesthesia with the patient or authorized representative who has indicated his/her understanding and acceptance.  ? ? ? ?Dental advisory given ? ?Plan Discussed with: CRNA ? ?Anesthesia Plan Comments:   ? ? ? ? ? ?Anesthesia Quick Evaluation ? ?

## 2021-11-28 ENCOUNTER — Ambulatory Visit (HOSPITAL_COMMUNITY): Payer: Medicare HMO | Admitting: Physician Assistant

## 2021-11-28 ENCOUNTER — Encounter (HOSPITAL_COMMUNITY): Payer: Self-pay | Admitting: Orthopedic Surgery

## 2021-11-28 ENCOUNTER — Encounter (HOSPITAL_COMMUNITY)
Admission: RE | Disposition: A | Discharge status: Home / Self Care | Payer: Self-pay | Source: Home / Self Care | Attending: Orthopedic Surgery

## 2021-11-28 ENCOUNTER — Ambulatory Visit (HOSPITAL_BASED_OUTPATIENT_CLINIC_OR_DEPARTMENT_OTHER): Payer: Medicare HMO | Admitting: Anesthesiology

## 2021-11-28 ENCOUNTER — Ambulatory Visit (HOSPITAL_COMMUNITY)
Admission: RE | Admit: 2021-11-28 | Discharge: 2021-11-28 | Disposition: A | Discharge status: Home / Self Care | Payer: Medicare HMO | Attending: Orthopedic Surgery | Admitting: Orthopedic Surgery

## 2021-11-28 ENCOUNTER — Other Ambulatory Visit: Payer: Self-pay

## 2021-11-28 DIAGNOSIS — M19012 Primary osteoarthritis, left shoulder: Secondary | ICD-10-CM | POA: Diagnosis present

## 2021-11-28 DIAGNOSIS — I10 Essential (primary) hypertension: Secondary | ICD-10-CM

## 2021-11-28 DIAGNOSIS — Z96653 Presence of artificial knee joint, bilateral: Secondary | ICD-10-CM | POA: Diagnosis not present

## 2021-11-28 DIAGNOSIS — F419 Anxiety disorder, unspecified: Secondary | ICD-10-CM | POA: Insufficient documentation

## 2021-11-28 DIAGNOSIS — E039 Hypothyroidism, unspecified: Secondary | ICD-10-CM

## 2021-11-28 DIAGNOSIS — K219 Gastro-esophageal reflux disease without esophagitis: Secondary | ICD-10-CM | POA: Diagnosis not present

## 2021-11-28 HISTORY — PX: TOTAL SHOULDER ARTHROPLASTY: SHX126

## 2021-11-28 LAB — TYPE AND SCREEN
ABO/RH(D): B POS
Antibody Screen: NEGATIVE

## 2021-11-28 SURGERY — ARTHROPLASTY, SHOULDER, TOTAL
Anesthesia: General | Site: Shoulder | Laterality: Left

## 2021-11-28 MED ORDER — DEXAMETHASONE SODIUM PHOSPHATE 10 MG/ML IJ SOLN
INTRAMUSCULAR | Status: AC
  Filled 2021-11-28: qty 1

## 2021-11-28 MED ORDER — PROPOFOL 10 MG/ML IV BOLUS
INTRAVENOUS | Status: AC
  Filled 2021-11-28: qty 20

## 2021-11-28 MED ORDER — BUPIVACAINE-EPINEPHRINE (PF) 0.5% -1:200000 IJ SOLN
INTRAMUSCULAR | Status: DC | PRN
  Administered 2021-11-28 (×5): 3 mL via PERINEURAL

## 2021-11-28 MED ORDER — PHENYLEPHRINE HCL (PRESSORS) 10 MG/ML IV SOLN
INTRAVENOUS | Status: AC
  Filled 2021-11-28: qty 1

## 2021-11-28 MED ORDER — LACTATED RINGERS IV BOLUS
500.0000 mL | Freq: Once | INTRAVENOUS | Status: AC
  Administered 2021-11-28: 500 mL via INTRAVENOUS

## 2021-11-28 MED ORDER — BISACODYL 5 MG PO TBEC: 5.0000 mg | DELAYED_RELEASE_TABLET | Freq: Every day | ORAL | Status: DC | PRN

## 2021-11-28 MED ORDER — LACTATED RINGERS IV SOLN: INTRAVENOUS | Status: DC

## 2021-11-28 MED ORDER — LIDOCAINE 2% (20 MG/ML) 5 ML SYRINGE
INTRAMUSCULAR | Status: DC | PRN
  Administered 2021-11-28: 50 mg via INTRAVENOUS

## 2021-11-28 MED ORDER — CEFAZOLIN SODIUM-DEXTROSE 2-4 GM/100ML-% IV SOLN
2.0000 g | INTRAVENOUS | Status: AC
  Administered 2021-11-28: 2 g via INTRAVENOUS
  Filled 2021-11-28: qty 100

## 2021-11-28 MED ORDER — OXYCODONE HCL 5 MG PO TABS
ORAL_TABLET | ORAL | Status: AC
  Filled 2021-11-28: qty 1

## 2021-11-28 MED ORDER — CHLORHEXIDINE GLUCONATE 0.12 % MT SOLN
15.0000 mL | Freq: Once | OROMUCOSAL | Status: AC
  Administered 2021-11-28: 15 mL via OROMUCOSAL

## 2021-11-28 MED ORDER — OXYCODONE HCL 5 MG PO TABS
5.0000 mg | ORAL_TABLET | Freq: Once | ORAL | Status: AC | PRN
  Administered 2021-11-28: 5 mg via ORAL

## 2021-11-28 MED ORDER — DEXAMETHASONE SODIUM PHOSPHATE 10 MG/ML IJ SOLN
INTRAMUSCULAR | Status: DC | PRN
  Administered 2021-11-28: 10 mg via INTRAVENOUS

## 2021-11-28 MED ORDER — LABETALOL HCL 5 MG/ML IV SOLN
5.0000 mg | INTRAVENOUS | Status: AC
  Administered 2021-11-28 (×4): 5 mg via INTRAVENOUS

## 2021-11-28 MED ORDER — ONDANSETRON HCL 4 MG/2ML IJ SOLN
INTRAMUSCULAR | Status: DC | PRN
  Administered 2021-11-28: 4 mg via INTRAVENOUS

## 2021-11-28 MED ORDER — FENTANYL CITRATE PF 50 MCG/ML IJ SOSY
PREFILLED_SYRINGE | INTRAMUSCULAR | Status: AC
  Filled 2021-11-28: qty 3

## 2021-11-28 MED ORDER — SODIUM CHLORIDE 0.9 % IV SOLN: 1000.0000 mg | INTRAVENOUS | Status: DC

## 2021-11-28 MED ORDER — PHENYLEPHRINE 40 MCG/ML (10ML) SYRINGE FOR IV PUSH (FOR BLOOD PRESSURE SUPPORT)
PREFILLED_SYRINGE | INTRAVENOUS | Status: DC | PRN
  Administered 2021-11-28 (×2): 80 ug via INTRAVENOUS

## 2021-11-28 MED ORDER — LABETALOL HCL 5 MG/ML IV SOLN
INTRAVENOUS | Status: AC
  Filled 2021-11-28: qty 4

## 2021-11-28 MED ORDER — STERILE WATER FOR IRRIGATION IR SOLN
Status: DC | PRN
  Administered 2021-11-28: 2000 mL

## 2021-11-28 MED ORDER — HYDROMORPHONE HCL 2 MG PO TABS: 2.0000 mg | ORAL_TABLET | ORAL | 0 refills | Status: DC | PRN

## 2021-11-28 MED ORDER — ROCURONIUM BROMIDE 10 MG/ML (PF) SYRINGE
PREFILLED_SYRINGE | INTRAVENOUS | Status: AC
  Filled 2021-11-28: qty 10

## 2021-11-28 MED ORDER — TRANEXAMIC ACID-NACL 1000-0.7 MG/100ML-% IV SOLN
1000.0000 mg | INTRAVENOUS | Status: AC
  Administered 2021-11-28: 1000 mg via INTRAVENOUS
  Filled 2021-11-28: qty 100

## 2021-11-28 MED ORDER — BUPIVACAINE LIPOSOME 1.3 % IJ SUSP
INTRAMUSCULAR | Status: DC | PRN
  Administered 2021-11-28 (×5): 2 mL via PERINEURAL

## 2021-11-28 MED ORDER — ROCURONIUM BROMIDE 10 MG/ML (PF) SYRINGE
PREFILLED_SYRINGE | INTRAVENOUS | Status: DC | PRN
Start: 2021-11-28 — End: 2021-11-28
  Administered 2021-11-28: 60 mg via INTRAVENOUS

## 2021-11-28 MED ORDER — NAPROXEN 500 MG PO TABS: 500.0000 mg | ORAL_TABLET | Freq: Two times a day (BID) | ORAL | 1 refills | Status: DC

## 2021-11-28 MED ORDER — FENTANYL CITRATE (PF) 100 MCG/2ML IJ SOLN
INTRAMUSCULAR | Status: DC | PRN
  Administered 2021-11-28 (×2): 50 ug via INTRAVENOUS

## 2021-11-28 MED ORDER — LACTATED RINGERS IV BOLUS
250.0000 mL | Freq: Once | INTRAVENOUS | Status: AC
  Administered 2021-11-28: 250 mL via INTRAVENOUS

## 2021-11-28 MED ORDER — ONDANSETRON HCL 4 MG/2ML IJ SOLN: 4.0000 mg | Freq: Four times a day (QID) | INTRAMUSCULAR | Status: DC | PRN

## 2021-11-28 MED ORDER — ONDANSETRON HCL 4 MG/2ML IJ SOLN
INTRAMUSCULAR | Status: AC
  Filled 2021-11-28: qty 2

## 2021-11-28 MED ORDER — OXYCODONE HCL 5 MG/5ML PO SOLN: 5.0000 mg | Freq: Once | ORAL | Status: AC | PRN

## 2021-11-28 MED ORDER — ACETAMINOPHEN 160 MG/5ML PO SOLN: 325.0000 mg | ORAL | Status: DC | PRN

## 2021-11-28 MED ORDER — ONDANSETRON HCL 4 MG/2ML IJ SOLN: 4.0000 mg | Freq: Once | INTRAMUSCULAR | Status: DC | PRN

## 2021-11-28 MED ORDER — PHENOL 1.4 % MT LIQD: 1.0000 | OROMUCOSAL | Status: DC | PRN

## 2021-11-28 MED ORDER — KETOROLAC TROMETHAMINE 30 MG/ML IJ SOLN
INTRAMUSCULAR | Status: AC
  Filled 2021-11-28: qty 1

## 2021-11-28 MED ORDER — ROCURONIUM BROMIDE 10 MG/ML (PF) SYRINGE
PREFILLED_SYRINGE | INTRAVENOUS | Status: AC
  Filled 2021-11-28: qty 20

## 2021-11-28 MED ORDER — 0.9 % SODIUM CHLORIDE (POUR BTL) OPTIME
TOPICAL | Status: DC | PRN
  Administered 2021-11-28: 1000 mL

## 2021-11-28 MED ORDER — ACETAMINOPHEN 325 MG PO TABS: 325.0000 mg | ORAL_TABLET | ORAL | Status: DC | PRN

## 2021-11-28 MED ORDER — METOCLOPRAMIDE HCL 5 MG PO TABS: 5.0000 mg | ORAL_TABLET | Freq: Three times a day (TID) | ORAL | Status: DC | PRN

## 2021-11-28 MED ORDER — METOCLOPRAMIDE HCL 5 MG/ML IJ SOLN: 5.0000 mg | Freq: Three times a day (TID) | INTRAMUSCULAR | Status: DC | PRN

## 2021-11-28 MED ORDER — ALUM & MAG HYDROXIDE-SIMETH 200-200-20 MG/5ML PO SUSP: 30.0000 mL | ORAL | Status: DC | PRN

## 2021-11-28 MED ORDER — VANCOMYCIN HCL 1000 MG IV SOLR
INTRAVENOUS | Status: DC | PRN
  Administered 2021-11-28: 1000 mg

## 2021-11-28 MED ORDER — PROPOFOL 10 MG/ML IV BOLUS
INTRAVENOUS | Status: DC | PRN
  Administered 2021-11-28: 200 mg via INTRAVENOUS

## 2021-11-28 MED ORDER — FENTANYL CITRATE (PF) 100 MCG/2ML IJ SOLN
INTRAMUSCULAR | Status: AC
Start: 2021-11-28 — End: ?
  Filled 2021-11-28: qty 2

## 2021-11-28 MED ORDER — POLYETHYLENE GLYCOL 3350 17 G PO PACK: 17.0000 g | PACK | Freq: Every day | ORAL | Status: DC | PRN

## 2021-11-28 MED ORDER — ORAL CARE MOUTH RINSE: 15.0000 mL | Freq: Once | OROMUCOSAL | Status: AC

## 2021-11-28 MED ORDER — PHENYLEPHRINE 40 MCG/ML (10ML) SYRINGE FOR IV PUSH (FOR BLOOD PRESSURE SUPPORT)
PREFILLED_SYRINGE | INTRAVENOUS | Status: AC
  Filled 2021-11-28: qty 50

## 2021-11-28 MED ORDER — ONDANSETRON HCL 4 MG PO TABS: 4.0000 mg | ORAL_TABLET | Freq: Four times a day (QID) | ORAL | Status: DC | PRN

## 2021-11-28 MED ORDER — MEPERIDINE HCL 50 MG/ML IJ SOLN: 6.2500 mg | INTRAMUSCULAR | Status: DC | PRN

## 2021-11-28 MED ORDER — DIPHENHYDRAMINE HCL 12.5 MG/5ML PO ELIX: 12.5000 mg | ORAL_SOLUTION | ORAL | Status: DC | PRN

## 2021-11-28 MED ORDER — VANCOMYCIN HCL 1000 MG IV SOLR
INTRAVENOUS | Status: AC
  Filled 2021-11-28: qty 20

## 2021-11-28 MED ORDER — MENTHOL 3 MG MT LOZG: 1.0000 | LOZENGE | OROMUCOSAL | Status: DC | PRN

## 2021-11-28 MED ORDER — ONDANSETRON HCL 4 MG PO TABS: 4.0000 mg | ORAL_TABLET | Freq: Three times a day (TID) | ORAL | 0 refills | Status: DC | PRN

## 2021-11-28 MED ORDER — DOCUSATE SODIUM 100 MG PO CAPS: 100.0000 mg | ORAL_CAPSULE | Freq: Two times a day (BID) | ORAL | Status: DC

## 2021-11-28 MED ORDER — SUGAMMADEX SODIUM 200 MG/2ML IV SOLN
INTRAVENOUS | Status: DC | PRN
  Administered 2021-11-28: 200 mg via INTRAVENOUS

## 2021-11-28 MED ORDER — KETOROLAC TROMETHAMINE 30 MG/ML IJ SOLN
30.0000 mg | Freq: Once | INTRAMUSCULAR | Status: AC | PRN
  Administered 2021-11-28: 30 mg via INTRAVENOUS

## 2021-11-28 MED ORDER — CYCLOBENZAPRINE HCL 10 MG PO TABS: 10.0000 mg | ORAL_TABLET | Freq: Three times a day (TID) | ORAL | 1 refills | Status: DC | PRN

## 2021-11-28 MED ORDER — MIDAZOLAM HCL 5 MG/5ML IJ SOLN
INTRAMUSCULAR | Status: DC | PRN
  Administered 2021-11-28: 2 mg via INTRAVENOUS

## 2021-11-28 MED ORDER — PANTOPRAZOLE SODIUM 40 MG PO TBEC: 40.0000 mg | DELAYED_RELEASE_TABLET | Freq: Every day | ORAL | Status: DC

## 2021-11-28 MED ORDER — FENTANYL CITRATE PF 50 MCG/ML IJ SOSY
25.0000 ug | PREFILLED_SYRINGE | INTRAMUSCULAR | Status: DC | PRN
  Administered 2021-11-28 (×2): 50 ug via INTRAVENOUS

## 2021-11-28 MED ORDER — MIDAZOLAM HCL 2 MG/2ML IJ SOLN
INTRAMUSCULAR | Status: AC
  Filled 2021-11-28: qty 2

## 2021-11-28 SURGICAL SUPPLY — 76 items
ADH SKN CLS APL DERMABOND .7 (GAUZE/BANDAGES/DRESSINGS) ×1
AID PSTN UNV HD RSTRNT DISP (MISCELLANEOUS) ×1
BAG COUNTER SPONGE SURGICOUNT (BAG) IMPLANT
BAG SPEC THK2 15X12 ZIP CLS (MISCELLANEOUS) ×1
BAG SPNG CNTER NS LX DISP (BAG)
BAG ZIPLOCK 12X15 (MISCELLANEOUS) ×3 IMPLANT
BIT DRILL 2.0X128 (BIT) IMPLANT
BLADE SAW SGTL 83.5X18.5 (BLADE) ×3 IMPLANT
BSPLAT GLND +2X24 MDLR (Joint) ×1 IMPLANT
COOLER ICEMAN CLASSIC (MISCELLANEOUS) ×3 IMPLANT
COVER BACK TABLE 60X90IN (DRAPES) ×3 IMPLANT
COVER SURGICAL LIGHT HANDLE (MISCELLANEOUS) ×3 IMPLANT
CUP SUT UNIV REVERS 36 NEUTRAL (Cup) ×1 IMPLANT
DERMABOND ADVANCED (GAUZE/BANDAGES/DRESSINGS) ×1
DERMABOND ADVANCED .7 DNX12 (GAUZE/BANDAGES/DRESSINGS) ×2 IMPLANT
DRAPE ORTHO SPLIT 77X108 STRL (DRAPES) ×4
DRAPE SHEET LG 3/4 BI-LAMINATE (DRAPES) ×3 IMPLANT
DRAPE SURG 17X11 SM STRL (DRAPES) ×3 IMPLANT
DRAPE SURG ORHT 6 SPLT 77X108 (DRAPES) ×4 IMPLANT
DRAPE TOP 10253 STERILE (DRAPES) ×3 IMPLANT
DRAPE U-SHAPE 47X51 STRL (DRAPES) ×3 IMPLANT
DRSG AQUACEL AG ADV 3.5X10 (GAUZE/BANDAGES/DRESSINGS) ×3 IMPLANT
DRSG TEGADERM 8X12 (GAUZE/BANDAGES/DRESSINGS) ×3 IMPLANT
DURAPREP 26ML APPLICATOR (WOUND CARE) ×3 IMPLANT
ELECT BLADE TIP CTD 4 INCH (ELECTRODE) ×3 IMPLANT
ELECT PENCIL ROCKER SW 15FT (MISCELLANEOUS) ×3 IMPLANT
ELECT REM PT RETURN 15FT ADLT (MISCELLANEOUS) ×3 IMPLANT
FACESHIELD WRAPAROUND (MASK) ×8 IMPLANT
FACESHIELD WRAPAROUND OR TEAM (MASK) ×8 IMPLANT
GLENOID UNI REV MOD 24 +2 LAT (Joint) ×1 IMPLANT
GLENOSPHERE 36 +4 LAT/24 (Joint) ×1 IMPLANT
GLOVE SRG 8 PF TXTR STRL LF DI (GLOVE) ×2 IMPLANT
GLOVE SURG ENC MOIS LTX SZ7 (GLOVE) ×3 IMPLANT
GLOVE SURG ENC MOIS LTX SZ7.5 (GLOVE) ×3 IMPLANT
GLOVE SURG UNDER POLY LF SZ7 (GLOVE) ×3 IMPLANT
GLOVE SURG UNDER POLY LF SZ8 (GLOVE) ×2
GOWN STRL REUS W/ TWL LRG LVL3 (GOWN DISPOSABLE) ×4 IMPLANT
GOWN STRL REUS W/TWL LRG LVL3 (GOWN DISPOSABLE) ×4
INSERT HUMERAL UNI REVERS 36 6 (Insert) ×1 IMPLANT
KIT BASIN OR (CUSTOM PROCEDURE TRAY) ×3 IMPLANT
KIT TURNOVER KIT A (KITS) IMPLANT
MANIFOLD NEPTUNE II (INSTRUMENTS) ×3 IMPLANT
NDL TAPERED W/ NITINOL LOOP (MISCELLANEOUS) IMPLANT
NEEDLE TAPERED W/ NITINOL LOOP (MISCELLANEOUS) IMPLANT
NS IRRIG 1000ML POUR BTL (IV SOLUTION) ×3 IMPLANT
PACK SHOULDER (CUSTOM PROCEDURE TRAY) ×3 IMPLANT
PAD COLD SHLDR WRAP-ON (PAD) ×3 IMPLANT
PIN NITINOL TARGETER 2.8 (PIN) IMPLANT
PIN SET MODULAR GLENOID SYSTEM (PIN) ×1 IMPLANT
PROTECTOR NERVE ULNAR (MISCELLANEOUS) ×3 IMPLANT
RESTRAINT HEAD UNIVERSAL NS (MISCELLANEOUS) ×3 IMPLANT
SCREW CENTRAL MODULAR 25 (Screw) ×1 IMPLANT
SCREW PERI LOCK 5.5X16 (Screw) ×1 IMPLANT
SCREW PERI LOCK 5.5X24 (Screw) ×2 IMPLANT
SCREW PERI LOCK 5.5X32 (Screw) ×1 IMPLANT
SLING ARM FOAM STRAP LRG (SOFTGOODS) ×1 IMPLANT
SLING ARM FOAM STRAP MED (SOFTGOODS) IMPLANT
SMARTMIX MINI TOWER (MISCELLANEOUS) ×2
SPONGE T-LAP 18X18 ~~LOC~~+RFID (SPONGE) ×3 IMPLANT
SPONGE T-LAP 4X18 ~~LOC~~+RFID (SPONGE) ×6 IMPLANT
STEM HUMERAL UNI REVERS SZ6 (Stem) ×1 IMPLANT
SUCTION FRAZIER HANDLE 12FR (TUBING) ×2
SUCTION TUBE FRAZIER 12FR DISP (TUBING) ×2 IMPLANT
SUT FIBERWIRE #2 38 T-5 BLUE (SUTURE)
SUT MNCRL AB 3-0 PS2 18 (SUTURE) ×3 IMPLANT
SUT MON AB 2-0 CT1 36 (SUTURE) ×3 IMPLANT
SUT VIC AB 1 CT1 36 (SUTURE) ×3 IMPLANT
SUTURE FIBERWR #2 38 T-5 BLUE (SUTURE) IMPLANT
SUTURE TAPE 1.3 40 TPR END (SUTURE) IMPLANT
SUTURETAPE 1.3 40 TPR END (SUTURE)
TOWEL OR 17X26 10 PK STRL BLUE (TOWEL DISPOSABLE) ×3 IMPLANT
TOWEL OR NON WOVEN STRL DISP B (DISPOSABLE) ×3 IMPLANT
TOWER SMARTMIX MINI (MISCELLANEOUS) IMPLANT
TUBE SUCTION HIGH CAP CLEAR NV (SUCTIONS) ×3 IMPLANT
WATER STERILE IRR 1000ML POUR (IV SOLUTION) ×6 IMPLANT
YANKAUER SUCT BULB TIP 10FT TU (MISCELLANEOUS) ×3 IMPLANT

## 2021-11-28 NOTE — Op Note (Signed)
11/28/2021 ? ?9:38 AM ? ?PATIENT:   Brooke Ward  56 y.o. female ? ?PRE-OPERATIVE DIAGNOSIS:  Left shoulder osteoarthritis ? 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 ? ?PROCEDURE: Left shoulder reverse arthroplasty utilizing a press-fit size 6 Arthrex stem with a neutral metaphysis, +6 constrained polyethylene insert, 36/+4 glenosphere and a small/+2 baseplate ? ?SURGEON:  Senaida Lange M.D. ? ?ASSISTANTS: Ralene Bathe, PA-C ? ?ANESTHESIA:   General endotracheal and interscalene block with Exparel ? ?EBL: 100 ? ?SPECIMEN: None ? ?Drains: None ? ? ?PATIENT DISPOSITION:  PACU - hemodynamically stable. ? ? ? ?PLAN OF CARE: Discharge to home after PACU ? ?Brief history: ? 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  Additionally her preop work-up included an MRI scan which showed a large cystic area in the inferior aspect of the glenoid vault and there is concerns regarding the bone stock available for the implantation of a standard glenoid.  She is brought to the operating room at this time for planned left shoulder arthroplasty consideration of anatomic versus reverse arthroplasty based on the intraoperative findings of bone quality involving the glenoid. ? ?Preoperatively, I counseled the patient regarding treatment options and risks versus benefits thereof.  Possible surgical complications were all reviewed including potential for bleeding, infection, neurovascular injury, persistent pain, loss of motion, anesthetic complication, failure of the implant, and possible need for  additional surgery. They understand and accept and agrees with our planned procedure. ? ? ?Procedure in detail: ? ?After undergoing routine preop evaluation the patient received prophylactic antibiotics and interscalene block with Exparel was established in the holding area by the anesthesia department.  Subsequently placed supine on the operating table and underwent smooth induction of a general endotracheal anesthesia.  Placed into the beachchair position and appropriately padded and protected.  The left shoulder girdle region was sterilely prepped and draped in standard fashion.  Timeout was called.  A deltopectoral approach was made to the left shoulder through an approximate 12 cm incision.  Skin flaps elevated dissection carried deeply and the deltopectoral interval was developed from proximal to distal with the vein taken laterally.  Electrocautery was used for hemostasis.  Conjoined tendon mobilized and retracted medially.  The long head bicep tendon was then tenodesed at the upper border the pectoralis major tendon with the proximal segment unroofed and excised.  The rotator cuff was then split from the apex of the bicipital groove to the base of the coracoid and the insertion of the subscap was then identified superiorly and inferiorly on the lesser tuberosity and an oscillating saw was then used to perform a lesser tuberosity osteotomy removing a thin wafer of bone.  Subscap was then tagged mobilized and reflected medially.  Capsular attachments were then divided from the anterior and inferior margins of the humeral neck allowing deliver the humeral head through the wound.  An extra medullary guide was then used to outline our proposed humeral head resection which we performed at approximately 30 degrees of retroversion with care taken to protect the surrounding rotator cuff.  Rondure was then used to remove the large retained marginal osteophytes.  The humeral metaphysis was then sized at approximately a  40 trunnion and it was prepared with the coring reamer and measured for a small cage screw with anticipation of a stemless anatomic arthroplasty.  A metal cap was then placed over the cut proximal humeral surface and we then exposed the glenoid.  A circumferential labral resection was then performed.  It was sized at a medium and a guidepin was then directed into the center of the glenoid and the glenoid was then reamed with a medium reamer to a stable subchondral bony bed.  Preparation was then initiated with drilling of the central hole and then we placed the superior and inferior peg and slot screws.  However upon broaching of the glenoid we found that the large cystic area on the inferior aspect of the glenoid was grossly incompetent with violation of the margin of the glenoid and bone insufficiency over the inferior approximately one quarter of the glenoid which would create catastrophic instability of a standard press-fit glenoid prosthesis and with this finding which had been our original concerns we converted to a reverse arthroplasty.  At this we then converted to preparation for a small glenoid baseplate with drilling and tapping for a 25 lag screw.  Our baseplate was then assembled and the baseplate was then inserted achieving excellent purchase on the central lag screw and the peripheral locking screws were all then placed using standard technique and obtaining excellent purchase with vancomycin powder applied to the threads of the lag screw.  A 36/+4 glenosphere was then impacted onto the baseplate and the central locking screw was then placed.  We then returned our attention back to the humeral metaphysis where the canal was opened hand reaming and broaching to a size 6 at approximate 20 degrees of retroversion.  Excellent fixation was achieved.  A neutral reaming guide was then used to prepare the metaphysis.  A trial implant was then placed showing good motion stability and soft tissue balance.  Our  trial was then removed the final implant was then assembled the canal was irrigated cleaned and dried with vancomycin powder spread liberally into the canal and our final implant was then seated with excellent fit and fixation.  We then performed a series of trial reductions and ultimately felt that a +6 poly with a constrained construct gave [place] the best motion stability and soft tissue balance.  The trial was then removed.  The implant was cleaned and dried the final poly was then impacted.  Final reduction was then performed which again showed excellent motion stability and soft tissue balance all much to our satisfaction.  The joint was copiously irrigated cleaned and dried.  The subscap was then repaired back to the eyelets on the collar the implant.  The balance of the vancomycin powder was then spread liberally throughout the deep soft tissue planes.  The deltopectoral interval was reapproximated with a series of figure-of-eight #1 Vicryl sutures.  2-0 Monocryl used to close the subcu layer and intracuticular 3-0 Monocryl for the skin followed by Dermabond and Aquacel dressing.  The left arm was then placed into a sling.  The patient was awakened, extubated, and taken to the recovery room in stable condition. ? ?Ralene Bathe, PA-C was utilized as an Geophysicist/field seismologist throughout this case, essential for help with positioning the patient, positioning extremity, tissue manipulation, implantation of the prosthesis, suture management, wound closure, and intraoperative decision-making. ? ?Senaida Lange MD ? ? ?Contact # [phone] ? ? ? ? ?

## 2021-11-28 NOTE — Anesthesia Procedure Notes (Signed)
Anesthesia Regional Block: Interscalene brachial plexus block  ? ?Pre-Anesthetic Checklist: , timeout performed,  Correct Patient, Correct Site, Correct Laterality,  Correct Procedure, Correct Position, site marked,  Risks and benefits discussed,  Surgical consent,  Pre-op evaluation,  At surgeon's request and post-op pain management ? ?Laterality: Left and Upper ? ?Prep: chloraprep     ?  ?Needles:  ?Injection technique: Single-shot ? ?Needle Type: Echogenic Stimulator Needle   ? ? ?Needle Length: 9cm  ?Needle Gauge: 20  ? ?Needle insertion depth: 1 cm ? ? ?Additional Needles: ? ? ?Procedures:,,,, ultrasound used (permanent image in chart),,    ?Narrative:  ?Start time: 11/28/2021 6:55 AM ?End time: 11/28/2021 7:05 AM ?Injection made incrementally with aspirations every 5 mL. ? ?Performed by: Personally  ?Anesthesiologist: Leilani Able, MD ? ? ? ? ?

## 2021-11-28 NOTE — Transfer of Care (Signed)
Immediate Anesthesia Transfer of Care Note ? ?Patient: Brooke Ward ? ?Procedure(s) Performed: Procedure(s) with comments: ?reverse shoulder arthroplasty (Left) - ? ?Patient Location: PACU ? ?Anesthesia Type:General ? ?Level of Consciousness: Alert, Awake, Oriented ? ?Airway & Oxygen Therapy: Patient Spontanous Breathing ? ?Post-op Assessment: Report given to RN ? ?Post vital signs: Reviewed and stable ? ?Last Vitals:  ?Vitals:  ? 11/28/21 0614 11/28/21 0953  ?BP: (!) 153/100 136/75  ?Pulse: (!) 110   ?Resp: 18   ?Temp: 36.7 ?C 36.4 ?C  ?SpO2: 94%   ? ? ?Complications: No apparent anesthesia complications ? ?

## 2021-11-28 NOTE — H&P (Signed)
Brooke Ward   ? ?Chief Complaint: Left shoulder osteoarthritis ?HPI: The patient is a 56 y.o. female with chronic and progressively increasing left shoulder pain related to severe osteoarthritis.  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  There is concern as to the stability of the glenoid and available bone stock for implantation of a traditional anatomic glenoid.  Due to the patient's increasing pain and functional limitations and failure to respond to prolonged attempts at conservative management, she is brought to the operating room at this time for planned left shoulder anatomic versus reverse arthroplasty. ? ?Past Medical History:  ?Diagnosis Date  ? Anxiety   ? Arthritis   ? GERD (gastroesophageal reflux disease)   ? Hypertension   ? Hypothyroidism   ? ? ?Past Surgical History:  ?Procedure Laterality Date  ? HERNIA REPAIR    ? KNEE ARTHROPLASTY Bilateral   ? WISDOM TOOTH EXTRACTION    ? WRIST SURGERY Left   ? ? ?History reviewed. No pertinent family history. ? ?Social History:  reports that she has never smoked. She has never used smokeless tobacco. She reports current alcohol use. She reports that she does not use drugs. ? ? ?Medications Prior to Admission  ?Medication Sig Dispense Refill  ? albuterol (VENTOLIN HFA) 108 (90 Base) MCG/ACT inhaler Inhale 2 puffs into the lungs every 6 (six) hours as needed for wheezing or shortness of breath.    ? alprazolam (XANAX) 2 MG tablet Take 2 mg by mouth in the morning, at noon, and at bedtime.    ? amphetamine-dextroamphetamine (ADDERALL) 30 MG tablet Take 30 mg by mouth 3 (three) times daily.    ? fluticasone (FLONASE) 50 MCG/ACT nasal spray Place 1 spray into both nostrils daily as needed for allergies.    ? gabapentin (NEURONTIN) 300 MG capsule Take 300 mg by mouth daily as needed (pain).    ? levothyroxine (SYNTHROID) 25 MCG tablet Take 25 mcg by mouth daily before breakfast.    ?  levothyroxine (SYNTHROID) 50 MCG tablet Take by mouth.    ? losartan-hydrochlorothiazide (HYZAAR) 50-12.5 MG tablet Take 1 tablet by mouth daily.    ? oxyCODONE (ROXICODONE) 15 MG immediate release tablet Take 15 mg by mouth in the morning, at noon, in the evening, and at bedtime.    ? pantoprazole (PROTONIX) 20 MG tablet pantoprazole 20 mg tablet,delayed release ? TAKE 1 TABLET (20 MG) BY MOUTH DAILY    ? triamcinolone cream (KENALOG) 0.1 % triamcinolone acetonide 0.1 % topical cream ? 1 APPLICATION TOPICALLY TO AFFECTED AREA 2 TIMES PER DAY    ? amLODipine (NORVASC) 10 MG tablet amlodipine 10 mg tablet ? TAKE 1 TABLET (10 MG) BY MOUTH DAILY    ? azithromycin (ZITHROMAX) 250 MG tablet Take by mouth.    ? CARESTART COVID-19 HOME TEST KIT See admin instructions.    ? fluconazole (DIFLUCAN) 150 MG tablet fluconazole 150 mg tablet ? TAKE 1 TABLET (150 MG) BY MOUTH WEEKLY    ? gabapentin (NEURONTIN) 600 MG tablet gabapentin 600 mg tablet ? TAKE 1 TABLET (600 MG) BY MOUTH 3 TIMES PER DAY    ? naloxone (NARCAN) nasal spray 4 mg/0.1 mL naloxone 4 mg/actuation nasal spray ? INHALE 1 SPRAY (4 MG) INTO NOSTRIL ONE TIME    ? ? ? ?Physical Exam: Left shoulder demonstrates profoundly restricted and painful motion as noted at recent office visits.  Her strength is globally decreased secondary to  pain and guarding.  She is otherwise neurovascular intact. ? ?Plain radiographs confirm complete obliteration of the joint space, subchondral sclerosis, and peripheral osteophyte formation. ? ?Recent MRI scan shows rotator cuff tendinosis with partial tearing but good muscle bulk.  Of note there is a significant cystic region within the glenoid vault. ? ?Vitals ? ?  ? ?Assessment/Plan ? ?Impression: Left shoulder osteoarthritis ? ?Plan of Action: Procedure(s): ?Left anatomic versus reverse shoulder arthroplasty ? ?Jeffrie Stander M Abdishakur Gottschall ?11/28/2021, 6:12 AM ?Contact # [phone] ? ? ? ? ? ?  ?

## 2021-11-28 NOTE — Discharge Instructions (Signed)

## 2021-11-28 NOTE — Evaluation (Signed)
Occupational Therapy Evaluation ?Patient Details ?Name: Brooke Ward ?MRN: [id number] ?DOB: 1966/04/30 ?Today's Date: 11/28/2021 ? ? ?History of Present Illness Patient is a 56 year old female s/p L reverse total shoulder arthroplasty. PMH: B TKA, L wrist surgery  ? ?Clinical Impression ?  ?Patient is a 56 year old female s/p shoulder replacement without functional use of left non-dominant upper extremity secondary to effects of surgery and interscalene block and shoulder precautions. Therapist provided education and instruction to patient and niece in regards to exercises, precautions, positioning, donning upper extremity clothing and bathing while maintaining shoulder precautions, ice and edema management and donning/doffing sling. Patient and neice verbalized understanding and demonstrated as needed. Patient needed assistance to donn shirt, underwear, pants, socks and shoes and provided with instruction on compensatory strategies to perform ADLs. Patient to follow up with MD for further therapy needs.  ?  ?   ? ?Recommendations for follow up therapy are one component of a multi-disciplinary discharge planning process, led by the attending physician.  Recommendations may be updated based on patient status, additional functional criteria and insurance authorization.  ? ?Follow Up Recommendations ? Follow physician's recommendations for discharge plan and follow up therapies  ?  ?Assistance Recommended at Discharge Intermittent Supervision/Assistance  ?Patient can return home with the following A little help with bathing/dressing/bathroom;Assistance with cooking/housework ? ?  ?   ?Equipment Recommendations ? None recommended by OT  ?  ?   ?Precautions / Restrictions Precautions ?Precautions: Shoulder ?Type of Shoulder Precautions: If sitting in controlled environment, ok to come out of sling to give neck a break. Please sleep in it to protect until follow up in office. OK to use operative arm for feeding, hygiene  and ADLs. Ok to instruct Pendulums and lap slides as exercises. Ok to use operative arm within the following parameters for ADL purposes     Ok for PROM, AAROM, AROM within pain tolerance and within the following ROM   ER 20   ABD 45   FE 60 ?Shoulder Interventions: Shoulder sling/immobilizer;Off for dressing/bathing/exercises ?Precaution Booklet Issued: Yes (comment) ?Required Braces or Orthoses: Sling ?Restrictions ?Weight Bearing Restrictions: Yes ?LUE Weight Bearing: Non weight bearing  ? ?  ? ?   ?Balance Overall balance assessment: Independent ?  ?  ?  ?  ?  ?  ?  ?  ?  ?  ?  ?  ?  ?  ?  ?  ?  ?  ?   ? ?ADL either performed or assessed with clinical judgement  ? ?ADL Overall ADL's : Needs assistance/impaired ?Eating/Feeding: Independent ?  ?Grooming: Independent ?  ?Upper Body Bathing: Minimal assistance;Standing ?  ?Lower Body Bathing: Minimal assistance;Sitting/lateral leans;Sit to/from stand ?  ?Upper Body Dressing : Moderate assistance;Sitting;Standing;Cueing for UE precautions;Cueing for sequencing ?Upper Body Dressing Details (indicate cue type and reason): Cues to thread L UE first, assist pulling up sleeves ?Lower Body Dressing: Minimal assistance;Sit to/from stand;Sitting/lateral leans ?  ?Toilet Transfer: Independent ?  ?Toileting- Clothing Manipulation and Hygiene: Minimal assistance;Sit to/from stand;Sitting/lateral lean ?  ?  ?  ?Functional mobility during ADLs: Independent ?General ADL Comments: Patient and niece instructed in shoulder precautions and how to maintain during self care tasks.  ? ? ? ? ?Pertinent Vitals/Pain Pain Assessment ?Pain Assessment: Faces ?Faces Pain Scale: Hurts a little bit ?Pain Location: L UE ?Pain Descriptors / Indicators: Heaviness, Numbness ?Pain Intervention(s): Monitored during session  ? ? ? ?Hand Dominance Right ?  ?Extremity/Trunk Assessment Upper Extremity Assessment ?Upper Extremity  Assessment: LUE deficits/detail ?LUE Deficits / Details: + nerve block ?   ?Lower Extremity Assessment ?Lower Extremity Assessment: Overall WFL for tasks assessed ?  ?Cervical / Trunk Assessment ?Cervical / Trunk Assessment: Normal ?  ?Communication Communication ?Communication: No difficulties ?  ?Cognition Arousal/Alertness: Awake/alert ?Behavior During Therapy: Impulsive ?Overall Cognitive Status: Within Functional Limits for tasks assessed ?  ?  ?  ?  ?  ?  ?  ?  ?  ?  ?  ?  ?  ?  ?  ?  ?  ?  ?  ?   ?Exercises Exercises: Shoulder ?  ?Shoulder Instructions Shoulder Instructions ?Donning/doffing shirt without moving shoulder: Moderate assistance;Caregiver independent with task;Patient able to independently direct caregiver ?Method for sponge bathing under operated UE: Minimal assistance;Patient able to independently direct caregiver;Caregiver independent with task ?Donning/doffing sling/immobilizer: Maximal assistance;Caregiver independent with task;Patient able to independently direct caregiver ?Correct positioning of sling/immobilizer: Minimal assistance;Caregiver independent with task;Patient able to independently direct caregiver ?Pendulum exercises (written home exercise program): Caregiver independent with task;Patient able to independently direct caregiver ?ROM for elbow, wrist and digits of operated UE: Caregiver independent with task;Patient able to independently direct caregiver ?Sling wearing schedule (on at all times/off for ADL's): Caregiver independent with task;Patient able to independently direct caregiver ?Proper positioning of operated UE when showering: Caregiver independent with task;Patient able to independently direct caregiver ?Dressing change:  (N/A) ?Positioning of UE while sleeping: Caregiver independent with task;Patient able to independently direct caregiver  ? ? ?Home Living Family/patient expects to be discharged to:: Private residence ?Living Arrangements: Children;Other relatives ?  ?  ?  ?  ?  ?  ?  ?  ?  ?  ?  ?  ?  ?  ?  ?  ?  ? ?  ?Prior  Functioning/Environment Prior Level of Function : Independent/Modified Independent ?  ?  ?  ?  ?  ?  ?  ?  ?  ? ?  ?  ?OT Problem List: Pain;Impaired UE functional use;Obesity;Decreased knowledge of precautions ?  ?   ?   ?OT Goals(Current goals can be found in the care plan section) Acute Rehab OT Goals ?Patient Stated Goal: Home ?OT Goal Formulation: All assessment and education complete, DC therapy  ? ?AM-PAC OT "6 Clicks" Daily Activity     ?Outcome Measure Help from another person eating meals?: None ?Help from another person taking care of personal grooming?: None ?Help from another person toileting, which includes using toliet, bedpan, or urinal?: A Little ?Help from another person bathing (including washing, rinsing, drying)?: A Little ?Help from another person to put on and taking off regular upper body clothing?: A Lot ?Help from another person to put on and taking off regular lower body clothing?: A Little ?6 Click Score: 19 ?  ?End of Session Equipment Utilized During Treatment:  (sling) ?Nurse Communication: Other (comment) (OT complete) ? ?Activity Tolerance: Patient tolerated treatment well ?Patient left: in chair;with call bell/phone within reach;with family/visitor present ? ?OT Visit Diagnosis: Pain ?Pain - Right/Left: Left ?Pain - part of body: Shoulder  ?              ?Time: 3953-2023 ?OT Time Calculation (min): 24 min ?Charges:  OT General Charges ?$OT Visit: 1 Visit ?OT Evaluation ?$OT Eval Low Complexity: 1 Low ?OT Treatments ?$Self Care/Home Management : 8-22 mins ? ?Marlyce Huge OT ?OT pager: [phone] ? ? ?Carmelia Roller ?11/28/2021, 12:20 PM ?

## 2021-11-28 NOTE — Anesthesia Postprocedure Evaluation (Signed)
Anesthesia Post Note ? ?Patient: Brooke Ward ? ?Procedure(s) Performed: reverse shoulder arthroplasty (Left: Shoulder) ? ?  ? ?Patient location during evaluation: PACU ?Anesthesia Type: General ?Level of consciousness: awake and sedated ?Pain management: pain level controlled ?Vital Signs Assessment: post-procedure vital signs reviewed and stable ?Respiratory status: spontaneous breathing ?Cardiovascular status: stable ?Postop Assessment: no apparent nausea or vomiting ?Anesthetic complications: no ? ? ?No notable events documented. ? ?Last Vitals:  ?Vitals:  ? 11/28/21 1120 11/28/21 1125  ?BP:    ?Pulse: 78 76  ?Resp: (!) 22 (!) 26  ?Temp:    ?SpO2: 100% (!) 88%  ?  ?Last Pain:  ?Vitals:  ? 11/28/21 1120  ?TempSrc:   ?PainSc: Asleep  ? ? ?  ?  ?  ?  ?  ?  ? ?Caren Macadam ? ? ? ? ?

## 2021-11-28 NOTE — Anesthesia Procedure Notes (Addendum)
Procedure Name: Intubation ?Date/Time: 11/28/2021 7:46 AM ?Performed by: Gerald Leitz, CRNA ?Pre-anesthesia Checklist: Patient identified, Patient being monitored, Timeout performed, Emergency Drugs available and Suction available ?Patient Re-evaluated:Patient Re-evaluated prior to induction ?Oxygen Delivery Method: Circle system utilized ?Preoxygenation: Pre-oxygenation with 100% oxygen ?Induction Type: IV induction ?Ventilation: Mask ventilation without difficulty ?Laryngoscope Size: Mac and 3 ?Grade View: Grade I ?Tube type: Oral ?Tube size: 7.0 mm ?Number of attempts: 1 ?Placement Confirmation: ETT inserted through vocal cords under direct vision, positive ETCO2 and breath sounds checked- equal and bilateral ?Secured at: 21 cm ?Tube secured with: Tape ?Dental Injury: Teeth and Oropharynx as per pre-operative assessment  ? ? ? ? ?

## 2021-12-02 ENCOUNTER — Encounter (HOSPITAL_COMMUNITY): Payer: Self-pay | Admitting: Orthopedic Surgery

## 2022-01-06 ENCOUNTER — Ambulatory Visit (INDEPENDENT_AMBULATORY_CARE_PROVIDER_SITE_OTHER): Payer: Medicare HMO | Admitting: Podiatry

## 2022-01-06 DIAGNOSIS — Z91199 Patient's noncompliance with other medical treatment and regimen due to unspecified reason: Secondary | ICD-10-CM

## 2022-01-06 NOTE — Progress Notes (Signed)
No show

## 2022-03-11 ENCOUNTER — Other Ambulatory Visit: Payer: Self-pay | Admitting: *Deleted

## 2022-03-11 ENCOUNTER — Ambulatory Visit: Payer: Medicare HMO | Admitting: Podiatrist

## 2023-08-06 NOTE — Progress Notes (Addendum)
COVID Vaccine Completed:  Date of COVID positive in last 90 days:  No  PCP - Lerry Liner, MD Cardiologist - N/A  Chest x-ray -  N/A EKG - 08-07-23 Epic Stress Test -  N/A ECHO -  N/A Cardiac Cath -  N/A Pacemaker/ICD device last checked: Spinal Cord Stimulator:  N/A  Bowel Prep - N/A  Sleep Study - N/A CPAP -   Fasting Blood Sugar - N/A Checks Blood Sugar _____ times a day  Last dose of GLP1 agonist-   N/A GLP1 instructions:  Hold 7 days before surgery    Last dose of SGLT-2 inhibitors-  N/A SGLT-2 instructions:  Hold 3 days before surgery    Blood Thinner Instructions:  Time Aspirin Instructions: ASA 81.  Patient will check to see if she needs to discontinue Last Dose:  Activity level:  Can go up a flight of stairs and perform activities of daily living without stopping and without symptoms of chest pain or shortness of breath.  Anesthesia review:  N/A  Patient denies shortness of breath, fever, cough and chest pain at PAT appointment  Patient verbalized understanding of instructions that were given to them at the PAT appointment. Patient was also instructed that they will need to review over the PAT instructions again at home before surgery.

## 2023-08-06 NOTE — Patient Instructions (Addendum)
SURGICAL WAITING ROOM VISITATION Patients having surgery or a procedure may have no more than 2 support people in the waiting area - these visitors may rotate.    Children under the age of 58 must have an adult with them who is not the patient.  If the patient needs to stay at the hospital during part of their recovery, the visitor guidelines for inpatient rooms apply. Pre-op nurse will coordinate an appropriate time for 1 support person to accompany patient in pre-op.  This support person may not rotate.    Please refer to the [facility] website for the visitor guidelines for Inpatients (after your surgery is over and you are in a regular room).       Your procedure is scheduled on: 08-19-23   Report to [facility] Main Entrance    Report to admitting at 7:40 AM   Call this number if you have problems the morning of surgery [phone]   Do not eat food :After Midnight.   After Midnight you may have the following liquids until 7:00 AM DAY OF SURGERY  Water Non-Citrus Juices (without pulp, NO RED-Apple, White grape, White cranberry) Black Coffee (NO MILK/CREAM OR CREAMERS, sugar ok)  Clear Tea (NO MILK/CREAM OR CREAMERS, sugar ok) regular and decaf                             Plain Jell-O (NO RED)                                           Fruit ices (not with fruit pulp, NO RED)                                     Popsicles (NO RED)                                                               Sports drinks like Gatorade (NO RED)                   The day of surgery:  Drink ONE (1) Pre-Surgery Clear Ensure by 7:00 AM the morning of surgery. Drink in one sitting. Do not sip.  This drink was given to you during your hospital  pre-op appointment visit. Nothing else to drink after completing the Pre-Surgery Clear Ensure.          If you have questions, please contact your surgeon's office.   FOLLOW  ANY ADDITIONAL PRE OP INSTRUCTIONS YOU RECEIVED FROM YOUR SURGEON'S  OFFICE!!!     Oral Hygiene is also important to reduce your risk of infection.                                    Remember - BRUSH YOUR TEETH THE MORNING OF SURGERY WITH YOUR REGULAR TOOTHPASTE   Do NOT smoke after Midnight   Take these medicines the morning of surgery with A SIP OF WATER:   Alprazolam  Atenolol  Escitalopram  Levothyroxine  Pantoprazole  Okay to use inhaler and nasal spray  Oxycodone if needed   Stop all vitamins and herbal supplements 7 days before surgery                              You may not have any metal on your body including hair pins, jewelry, and body piercing             Do not wear make-up, lotions, powders, perfumes or deodorant  Do not wear nail polish including gel and S&S, artificial/acrylic nails, or any other type of covering on natural nails including finger and toenails. If you have artificial nails, gel coating, etc. that needs to be removed by a nail salon please have this removed prior to surgery or surgery may need to be canceled/ delayed if the surgeon/ anesthesia feels like they are unable to be safely monitored.   Do not shave  48 hours prior to surgery.     Do not bring valuables to the hospital. [facility] IS NOT RESPONSIBLE   FOR VALUABLES.   Contacts, dentures or bridgework may not be worn into surgery.   Bring small overnight bag day of surgery.   DO NOT BRING YOUR HOME MEDICATIONS TO THE HOSPITAL. PHARMACY WILL DISPENSE MEDICATIONS LISTED ON YOUR MEDICATION LIST TO YOU DURING YOUR ADMISSION IN THE HOSPITAL!                Please read over the following fact sheets you were given: IF YOU HAVE QUESTIONS ABOUT YOUR PRE-OP INSTRUCTIONS PLEASE CALL [phone] Gwen  If you received a COVID test during your pre-op visit  it is requested that you wear a mask when out in public, stay away from anyone that may not be feeling well and notify your surgeon if you develop symptoms. If you test positive for Covid or have been in contact  with anyone that has tested positive in the last 10 days please notify you surgeon.    Pre-operative 5 CHG Bath Instructions   You can play a key role in reducing the risk of infection after surgery. Your skin needs to be as free of germs as possible. You can reduce the number of germs on your skin by washing with CHG (chlorhexidine gluconate) soap before surgery. CHG is an antiseptic soap that kills germs and continues to kill germs even after washing.   DO NOT use if you have an allergy to chlorhexidine/CHG or antibacterial soaps. If your skin becomes reddened or irritated, stop using the CHG and notify one of our RNs at [phone].   Please shower with the CHG soap starting 4 days before surgery using the following schedule:     Please keep in mind the following:  DO NOT shave, including legs and underarms, starting the day of your first shower.   You may shave your face at any point before/day of surgery.  Place clean sheets on your bed the day you start using CHG soap. Use a clean washcloth (not used since being washed) for each shower. DO NOT sleep with pets once you start using the CHG.   CHG Shower Instructions:  If you choose to wash your hair and private area, wash first with your normal shampoo/soap.  After you use shampoo/soap, rinse your hair and body thoroughly to remove shampoo/soap residue.  Turn the water OFF and apply about 3 tablespoons (45 ml) of CHG soap to a CLEAN  washcloth.  Apply CHG soap ONLY FROM YOUR NECK DOWN TO YOUR TOES (washing for 3-5 minutes)  DO NOT use CHG soap on face, private areas, open wounds, or sores.  Pay special attention to the area where your surgery is being performed.  If you are having back surgery, having someone wash your back for you may be helpful. Wait 2 minutes after CHG soap is applied, then you may rinse off the CHG soap.  Pat dry with a clean towel  Put on clean clothes/pajamas   If you choose to wear lotion, please use ONLY  the CHG-compatible lotions on the back of this paper.     Additional instructions for the day of surgery: DO NOT APPLY any lotions, deodorants, cologne, or perfumes.   Put on clean/comfortable clothes.  Brush your teeth.  Ask your nurse before applying any prescription medications to the skin.      CHG Compatible Lotions   Aveeno Moisturizing lotion  Cetaphil Moisturizing Cream  Cetaphil Moisturizing Lotion  Clairol Herbal Essence Moisturizing Lotion, Dry Skin  Clairol Herbal Essence Moisturizing Lotion, Extra Dry Skin  Clairol Herbal Essence Moisturizing Lotion, Normal Skin  Curel Age Defying Therapeutic Moisturizing Lotion with Alpha Hydroxy  Curel Extreme Care Body Lotion  Curel Soothing Hands Moisturizing Hand Lotion  Curel Therapeutic Moisturizing Cream, Fragrance-Free  Curel Therapeutic Moisturizing Lotion, Fragrance-Free  Curel Therapeutic Moisturizing Lotion, Original Formula  Eucerin Daily Replenishing Lotion  Eucerin Dry Skin Therapy Plus Alpha Hydroxy Crme  Eucerin Dry Skin Therapy Plus Alpha Hydroxy Lotion  Eucerin Original Crme  Eucerin Original Lotion  Eucerin Plus Crme Eucerin Plus Lotion  Eucerin TriLipid Replenishing Lotion  Keri Anti-Bacterial Hand Lotion  Keri Deep Conditioning Original Lotion Dry Skin Formula Softly Scented  Keri Deep Conditioning Original Lotion, Fragrance Free Sensitive Skin Formula  Keri Lotion Fast Absorbing Fragrance Free Sensitive Skin Formula  Keri Lotion Fast Absorbing Softly Scented Dry Skin Formula  Keri Original Lotion  Keri Skin Renewal Lotion Keri Silky Smooth Lotion  Keri Silky Smooth Sensitive Skin Lotion  Nivea Body Creamy Conditioning Oil  Nivea Body Extra Enriched Lotion  Nivea Body Original Lotion  Nivea Body Sheer Moisturizing Lotion Nivea Crme  Nivea Skin Firming Lotion  NutraDerm 30 Skin Lotion  NutraDerm Skin Lotion  NutraDerm Therapeutic Skin Cream  NutraDerm Therapeutic Skin Lotion  ProShield  Protective Hand Cream  Provon moisturizing lotion   PATIENT SIGNATURE_________________________________  NURSE SIGNATURE__________________________________  ________________________________________________________________________    Brooke Ward  An incentive spirometer is a tool that can help keep your lungs clear and active. This tool measures how well you are filling your lungs with each breath. Taking long deep breaths may help reverse or decrease the chance of developing breathing (pulmonary) problems (especially infection) following: A long period of time when you are unable to move or be active. BEFORE THE PROCEDURE  If the spirometer includes an indicator to show your best effort, your nurse or respiratory therapist will set it to a desired goal. If possible, sit up straight or lean slightly forward. Try not to slouch. Hold the incentive spirometer in an upright position. INSTRUCTIONS FOR USE  Sit on the edge of your bed if possible, or sit up as far as you can in bed or on a chair. Hold the incentive spirometer in an upright position. Breathe out normally. Place the mouthpiece in your mouth and seal your lips tightly around it. Breathe in slowly and as deeply as possible, raising the piston or the ball toward the  top of the column. Hold your breath for 3-5 seconds or for as long as possible. Allow the piston or ball to fall to the bottom of the column. Remove the mouthpiece from your mouth and breathe out normally. Rest for a few seconds and repeat Steps 1 through 7 at least 10 times every 1-2 hours when you are awake. Take your time and take a few normal breaths between deep breaths. The spirometer may include an indicator to show your best effort. Use the indicator as a goal to work toward during each repetition. After each set of 10 deep breaths, practice coughing to be sure your lungs are clear. If you have an incision (the cut made at the time of surgery), support your  incision when coughing by placing a pillow or rolled up towels firmly against it. Once you are able to get out of bed, walk around indoors and cough well. You may stop using the incentive spirometer when instructed by your caregiver.  RISKS AND COMPLICATIONS Take your time so you do not get dizzy or light-headed. If you are in pain, you may need to take or ask for pain medication before doing incentive spirometry. It is harder to take a deep breath if you are having pain. AFTER USE Rest and breathe slowly and easily. It can be helpful to keep track of a log of your progress. Your caregiver can provide you with a simple table to help with this. If you are using the spirometer at home, follow these instructions: SEEK MEDICAL CARE IF:  You are having difficultly using the spirometer. You have trouble using the spirometer as often as instructed. Your pain medication is not giving enough relief while using the spirometer. You develop fever of 100.5 F (38.1 C) or higher. SEEK IMMEDIATE MEDICAL CARE IF:  You cough up bloody sputum that had not been present before. You develop fever of 102 F (38.9 C) or greater. You develop worsening pain at or near the incision site. MAKE SURE YOU:  Understand these instructions. Will watch your condition. Will get help right away if you are not doing well or get worse. Document Released: 01/12/2007 Document Revised: 11/24/2011 Document Reviewed: 03/15/2007 ExitCare Patient Information 2014 Exit[employer], [place].   ________________________________________________________________________ WHAT IS A BLOOD TRANSFUSION? Blood Transfusion Information  A transfusion is the replacement of blood or some of its parts. Blood is made up of multiple cells which provide different functions. Red blood cells carry oxygen and are used for blood loss replacement. White blood cells fight against infection. Platelets control bleeding. Plasma helps clot blood. Other blood  products are available for specialized needs, such as hemophilia or other clotting disorders. BEFORE THE TRANSFUSION  Who gives blood for transfusions?  Healthy volunteers who are fully evaluated to make sure their blood is safe. This is blood bank blood. Transfusion therapy is the safest it has ever been in the practice of medicine. Before blood is taken from a donor, a complete history is taken to make sure that person has no history of diseases nor engages in risky social behavior (examples are intravenous drug use or sexual activity with multiple partners). The donor's travel history is screened to minimize risk of transmitting infections, such as malaria. The donated blood is tested for signs of infectious diseases, such as HIV and hepatitis. The blood is then tested to be sure it is compatible with you in order to minimize the chance of a transfusion reaction. If you or a relative donates blood, this  is often done in anticipation of surgery and is not appropriate for emergency situations. It takes many days to process the donated blood. RISKS AND COMPLICATIONS Although transfusion therapy is very safe and saves many lives, the main dangers of transfusion include:  Getting an infectious disease. Developing a transfusion reaction. This is an allergic reaction to something in the blood you were given. Every precaution is taken to prevent this. The decision to have a blood transfusion has been considered carefully by your caregiver before blood is given. Blood is not given unless the benefits outweigh the risks. AFTER THE TRANSFUSION Right after receiving a blood transfusion, you will usually feel much better and more energetic. This is especially true if your red blood cells have gotten low (anemic). The transfusion raises the level of the red blood cells which carry oxygen, and this usually causes an energy increase. The nurse administering the transfusion will monitor you carefully for  complications. HOME CARE INSTRUCTIONS  No special instructions are needed after a transfusion. You may find your energy is better. Speak with your caregiver about any limitations on activity for underlying diseases you may have. SEEK MEDICAL CARE IF:  Your condition is not improving after your transfusion. You develop redness or irritation at the intravenous (IV) site. SEEK IMMEDIATE MEDICAL CARE IF:  Any of the following symptoms occur over the next 12 hours: Shaking chills. You have a temperature by mouth above 102 F (38.9 C), not controlled by medicine. Chest, back, or muscle pain. People around you feel you are not acting correctly or are confused. Shortness of breath or difficulty breathing. Dizziness and fainting. You get a rash or develop hives. You have a decrease in urine output. Your urine turns a dark color or changes to pink, red, or brown. Any of the following symptoms occur over the next 10 days: You have a temperature by mouth above 102 F (38.9 C), not controlled by medicine. Shortness of breath. Weakness after normal activity. The white part of the eye turns yellow (jaundice). You have a decrease in the amount of urine or are urinating less often. Your urine turns a dark color or changes to pink, red, or brown. Document Released: 08/29/2000 Document Revised: 11/24/2011 Document Reviewed: 04/17/2008 [facility] Patient Information 2014 [place], [place].  _______________________________________________________________________

## 2023-08-07 ENCOUNTER — Encounter (HOSPITAL_COMMUNITY)
Admission: RE | Admit: 2023-08-07 | Discharge: 2023-08-07 | Disposition: A | Discharge status: Home / Self Care | Payer: Medicare PPO | Source: Ambulatory Visit | Attending: Orthopedic Surgery | Admitting: Orthopedic Surgery

## 2023-08-07 ENCOUNTER — Encounter (HOSPITAL_COMMUNITY): Payer: Self-pay

## 2023-08-07 ENCOUNTER — Other Ambulatory Visit: Payer: Self-pay

## 2023-08-07 VITALS — BP 142/86 | HR 80 | Temp 97.7°F | Resp 16 | Ht 63.0 in | Wt 245.4 lb

## 2023-08-07 DIAGNOSIS — I10 Essential (primary) hypertension: Secondary | ICD-10-CM | POA: Diagnosis not present

## 2023-08-07 DIAGNOSIS — Z01818 Encounter for other preprocedural examination: Secondary | ICD-10-CM | POA: Diagnosis not present

## 2023-08-07 HISTORY — DX: Unspecified asthma, uncomplicated: J45.909

## 2023-08-07 HISTORY — DX: Depression, unspecified: F32.A

## 2023-08-07 LAB — CBC
HCT: 37.2 % (ref 36.0–46.0)
Hemoglobin: 12.3 g/dL (ref 12.0–15.0)
MCH: 30.1 pg (ref 26.0–34.0)
MCHC: 33.1 g/dL (ref 30.0–36.0)
MCV: 91.2 fL (ref 80.0–100.0)
Platelets: 357 10*3/uL (ref 150–400)
RBC: 4.08 MIL/uL (ref 3.87–5.11)
RDW: 13.2 % (ref 11.5–15.5)
WBC: 6.7 10*3/uL (ref 4.0–10.5)
nRBC: 0 % (ref 0.0–0.2)

## 2023-08-07 LAB — BASIC METABOLIC PANEL
Anion gap: 12 (ref 5–15)
BUN: 20 mg/dL (ref 6–20)
CO2: 23 mmol/L (ref 22–32)
Calcium: 9.2 mg/dL (ref 8.9–10.3)
Chloride: 103 mmol/L (ref 98–111)
Creatinine, Ser: 0.83 mg/dL (ref 0.44–1.00)
GFR, Estimated: >60 mL/min (ref >60)
Glucose, Bld: 114 mg/dL — ABNORMAL HIGH (ref 70–99)
Potassium: 3.7 mmol/L (ref 3.5–5.1)
Sodium: 138 mmol/L (ref 135–145)

## 2023-08-07 LAB — TYPE AND SCREEN
ABO/RH(D): B POS
Antibody Screen: NEGATIVE

## 2023-08-09 LAB — SURGICAL PCR SCREEN
MRSA, PCR: NEGATIVE
Staphylococcus aureus: POSITIVE — AB

## 2023-08-10 NOTE — H&P (Signed)
REVISION LEFT TOTAL KNEE ADMISSION H&P  Patient is being admitted for left total knee arthroplasty.  Subjective:  Chief Complaint: Left knee pain.  HPI: Brooke Ward is a 57 year old female who presents due to left total knee arthroplasty pain. The patient reports that her knee feels loose and she experiences pain radiating from her knee down to her feet, which she believes is originating from her back. This pain has been present for approximately two months. The patient had her left knee arthroplasty performed in her 30s, and she is now 57 years old. She mentions that her knee has been giving out on her, making her scared to walk. She also describes an incident where she experienced severe pain in her lower back while in the bathroom, which caused her to grab the sink for support. The patient has been trying to lose weight and is currently at 258 lbs, aiming to lose more weight before her surgery. She has 11 grandchildren and finds it difficult to babysit due to her knee and back pain. The patient is scheduled for revision total knee arthroplasty and is on a cancellation list to potentially move up her surgery date. She is also advised to see a back specialist for her significant low back pain radiating down her left leg.  There are no problems to display for this patient.   Past Medical History:  Diagnosis Date   Anxiety    Arthritis    Asthma    Depression    GERD (gastroesophageal reflux disease)    Hypertension    Hypothyroidism     Past Surgical History:  Procedure Laterality Date   HERNIA REPAIR     KNEE ARTHROPLASTY Bilateral    TOTAL SHOULDER ARTHROPLASTY Left 11/28/2021   Procedure: reverse shoulder arthroplasty;  Surgeon: Francena Hanly, MD;  Location: [facility];  Service: Orthopedics;  Laterality: Left;    WISDOM TOOTH EXTRACTION     WRIST SURGERY Left     Prior to Admission medications   Medication Sig Start Date End Date Taking? Authorizing Provider  albuterol  (VENTOLIN HFA) 108 (90 Base) MCG/ACT inhaler Inhale 2 puffs into the lungs every 6 (six) hours as needed for wheezing or shortness of breath.   Yes [provider]  ALPRAZolam Prudy Feeler) 1 MG tablet Take 1 mg by mouth in the morning, at noon, and at bedtime. 08/01/20  Yes [provider]  amitriptyline (ELAVIL) 50 MG tablet Take 50 mg by mouth at bedtime.   Yes [provider]  amphetamine-dextroamphetamine (ADDERALL) 20 MG tablet Take 20-40 mg by mouth See admin instructions. Take 40 mg by mouth in the morning and take 20 mg in the afternoon. 08/01/20  Yes [provider]  aspirin EC 81 MG tablet Take 81 mg by mouth daily. Swallow whole.   Yes [provider]  atenolol (TENORMIN) 25 MG tablet Take 25 mg by mouth daily.   Yes [provider]  docusate sodium (COLACE) 100 MG capsule Take 100 mg by mouth 2 (two) times daily as needed for mild constipation.   Yes [provider]  escitalopram (LEXAPRO) 10 MG tablet Take 10 mg by mouth daily.   Yes [provider]  fluticasone (FLONASE) 50 MCG/ACT nasal spray Place 1 spray into both nostrils daily as needed for allergies. 08/01/20  Yes [provider]  levothyroxine (SYNTHROID) 50 MCG tablet Take 50 mcg by mouth daily before breakfast. 10/21/21  Yes [provider]  losartan-hydrochlorothiazide (HYZAAR) 100-25 MG tablet Take 1  tablet by mouth daily.   Yes [provider]  oxyCODONE (ROXICODONE) 15 MG immediate release tablet Take 15 mg by mouth in the morning, at noon, in the evening, and at bedtime. 07/24/20  Yes [provider]  pantoprazole (PROTONIX) 20 MG tablet Take 20 mg by mouth daily.   Yes [provider]  traZODone (DESYREL) 50 MG tablet Take 50 mg by mouth at bedtime as needed for sleep.   Yes [provider]  triamcinolone (KENALOG) 0.025 % ointment Apply 1 Application topically 2 (two) times daily.   Yes [provider]  cyclobenzaprine (FLEXERIL) 10 MG tablet Take 1 tablet (10 mg total) by mouth 3 (three) times daily as needed for muscle spasms. Patient not taking: Reported on 08/06/2023 11/28/21   Shuford, French Ana, PA-C  HYDROmorphone (DILAUDID) 2 MG tablet Take 1-2 tablets (2-4 mg total) by mouth every 4 (four) hours as needed (prn severe pain to use instead of oxycodone for post op pain). Patient not taking: Reported on 08/06/2023 11/28/21   Shuford, French Ana, PA-C  naproxen (NAPROSYN) 500 MG tablet Take 1 tablet (500 mg total) by mouth 2 (two) times daily with a meal. Patient not taking: Reported on 08/06/2023 11/28/21   Shuford, French Ana, PA-C  ondansetron (ZOFRAN) 4 MG tablet Take 1 tablet (4 mg total) by mouth every 8 (eight) hours as needed for nausea or vomiting. Patient not taking: Reported on 08/06/2023 11/28/21   Shuford, French Ana, PA-C    Allergies  Allergen Reactions   Lisinopril Swelling    Throat swelling    Social History   Socioeconomic History   Marital status: Single    Spouse name: Not on file   Number of children: Not on file   Years of education: Not on file   Highest education level: Not on file  Occupational History   Not on file  Tobacco Use   Smoking status: Never   Smokeless tobacco: Never  Vaping Use   Vaping status: Never Used  Substance and Sexual Activity   Alcohol use: Yes    Comment: occ   Drug use: Never   Sexual activity: Not on file  Other Topics Concern   Not on file  Social History Narrative   Not on file   Social Determinants of Health   Financial Resource Strain: Not on file  Food Insecurity: Not on file  Transportation Needs: Not on file  Physical Activity: Not on file  Stress: Not on file  Social Connections: Not on file  Intimate Partner Violence: Not on file    Tobacco Use: Low Risk  (08/07/2023)   Patient History    Smoking Tobacco Use: Never    Smokeless Tobacco Use: Never    Passive Exposure: Not on file   Social History    Substance and Sexual Activity  Alcohol Use Yes   Comment: occ    No family history on file.  ROS  Objective:  Physical Exam: - Well developed Female, alert, oriented, no apparent distress.    - Left hip Normal range of motion with no discomfort.    - Left knee No warmth or effusion. Range of motion approximately 5 to 100 degrees. Significant varus, valgus, and AP laxity with pain on range of motion. Antalgic gait pattern on the left.   IMAGING: The radiographs from 04/17/2023 demonstrate a loose left tibial component of the LCS total knee arthroplasty. There is also asymmetric wear of the polyethylene.     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 The surgery will involve removing the current metal components and replacing them with new ones. The procedure will take approximately two hours, and the patient will need to stay in the hospital for one or two nights. We will try to move up her surgery date if possible.   The risks and benefits of total knee arthroplasty were presented and reviewed. The risks due to aseptic loosening, infection, stiffness, patella tracking problems, thromboembolic complications and other imponderables were discussed. The patient acknowledged the explanation, agreed to proceed with the plan and consent was signed. Patient is being admitted for inpatient treatment for surgery, pain control, PT, OT, prophylactic antibiotics, VTE prophylaxis, progressive ambulation and ADLs and discharge planning. The patient is planning to be discharged home.  Anticipated LOS equal to or greater than 2 midnights due to - Age 64 and older with one or more of the following:  - Obesity  - Expected need for hospital services (PT, OT, Nursing) required for safe  discharge  - Anticipated need for postoperative skilled nursing care or inpatient rehab  -  Active co-morbidities: Chronic pain requiring opiods OR   - Unanticipated findings during/Post Surgery: Slow post-op progression: GI, pain control, mobility  - Patient is a high risk of re-admission due to: None  Therapy Plans: [facility] [place] Disposition: Home with daughters and son Planned DVT Prophylaxis: Aspirin 81mg  BID  DME Needed: Dan Humphreys ordered PCP: Lerry Liner, MD (clearance received) TXA: IV Allergies: lisinopril (throat swelling) Anesthesia Concerns: None BMI: 42.6* Last HgbA1c: Not diabetic Pharmacy: [facility]  Other: -Taking oxycodone 15mg  3-4x daily  - Patient was instructed on what medications to stop prior to surgery. - Follow-up visit in 2 weeks with Dr. Lequita Halt - Begin physical therapy following surgery - Pre-operative lab work as pre-surgical testing - Prescriptions will be provided in hospital at time of discharge  Weston Brass, PA-C Orthopedic Surgery [facility] [place]

## 2023-08-10 NOTE — Progress Notes (Signed)
PCR results sent to Dr. Lequita Halt to review.  ?

## 2023-08-19 ENCOUNTER — Encounter (HOSPITAL_COMMUNITY)
Admission: RE | Disposition: A | Discharge status: Home / Self Care | Payer: Self-pay | Source: Home / Self Care | Attending: Orthopedic Surgery

## 2023-08-19 ENCOUNTER — Encounter (HOSPITAL_COMMUNITY): Payer: Self-pay | Admitting: Orthopedic Surgery

## 2023-08-19 ENCOUNTER — Other Ambulatory Visit: Payer: Self-pay

## 2023-08-19 ENCOUNTER — Inpatient Hospital Stay (HOSPITAL_COMMUNITY): Payer: Medicare PPO | Admitting: Anesthesiology

## 2023-08-19 ENCOUNTER — Inpatient Hospital Stay (HOSPITAL_COMMUNITY): Payer: Self-pay | Admitting: Anesthesiology

## 2023-08-19 ENCOUNTER — Inpatient Hospital Stay (HOSPITAL_COMMUNITY)
Admission: RE | Admit: 2023-08-19 | Discharge: 2023-08-22 | DRG: 467 | Disposition: A | Discharge status: Home / Self Care | Payer: Medicare PPO | Attending: Orthopedic Surgery | Admitting: Orthopedic Surgery

## 2023-08-19 DIAGNOSIS — Z6841 Body Mass Index (BMI) 40.0 and over, adult: Secondary | ICD-10-CM

## 2023-08-19 DIAGNOSIS — Y831 Surgical operation with implant of artificial internal device as the cause of abnormal reaction of the patient, or of later complication, without mention of misadventure at the time of the procedure: Secondary | ICD-10-CM | POA: Diagnosis present

## 2023-08-19 DIAGNOSIS — T84033A Mechanical loosening of internal left knee prosthetic joint, initial encounter: Secondary | ICD-10-CM

## 2023-08-19 DIAGNOSIS — I10 Essential (primary) hypertension: Secondary | ICD-10-CM | POA: Diagnosis present

## 2023-08-19 DIAGNOSIS — F32A Depression, unspecified: Secondary | ICD-10-CM | POA: Diagnosis present

## 2023-08-19 DIAGNOSIS — G8929 Other chronic pain: Secondary | ICD-10-CM | POA: Diagnosis present

## 2023-08-19 DIAGNOSIS — E039 Hypothyroidism, unspecified: Secondary | ICD-10-CM | POA: Diagnosis present

## 2023-08-19 DIAGNOSIS — K219 Gastro-esophageal reflux disease without esophagitis: Secondary | ICD-10-CM | POA: Diagnosis present

## 2023-08-19 DIAGNOSIS — T84018A Broken internal joint prosthesis, other site, initial encounter: Principal | ICD-10-CM

## 2023-08-19 DIAGNOSIS — J45909 Unspecified asthma, uncomplicated: Secondary | ICD-10-CM | POA: Diagnosis present

## 2023-08-19 DIAGNOSIS — E66813 Obesity, class 3: Secondary | ICD-10-CM | POA: Diagnosis present

## 2023-08-19 DIAGNOSIS — Y792 Prosthetic and other implants, materials and accessory orthopedic devices associated with adverse incidents: Secondary | ICD-10-CM | POA: Diagnosis present

## 2023-08-19 DIAGNOSIS — F419 Anxiety disorder, unspecified: Secondary | ICD-10-CM | POA: Diagnosis present

## 2023-08-19 DIAGNOSIS — Z96612 Presence of left artificial shoulder joint: Secondary | ICD-10-CM | POA: Diagnosis present

## 2023-08-19 DIAGNOSIS — E1165 Type 2 diabetes mellitus with hyperglycemia: Secondary | ICD-10-CM | POA: Diagnosis present

## 2023-08-19 DIAGNOSIS — Z888 Allergy status to other drugs, medicaments and biological substances status: Secondary | ICD-10-CM | POA: Diagnosis not present

## 2023-08-19 DIAGNOSIS — Z79890 Hormone replacement therapy (postmenopausal): Secondary | ICD-10-CM

## 2023-08-19 DIAGNOSIS — Z79899 Other long term (current) drug therapy: Secondary | ICD-10-CM | POA: Diagnosis not present

## 2023-08-19 DIAGNOSIS — Z96659 Presence of unspecified artificial knee joint: Principal | ICD-10-CM

## 2023-08-19 DIAGNOSIS — Z7982 Long term (current) use of aspirin: Secondary | ICD-10-CM

## 2023-08-19 HISTORY — PX: TOTAL KNEE REVISION: SHX996

## 2023-08-19 SURGERY — TOTAL KNEE REVISION
Anesthesia: Spinal | Site: Knee | Laterality: Left

## 2023-08-19 MED ORDER — SODIUM CHLORIDE 0.9 % IV SOLN
INTRAVENOUS | Status: DC
Start: 2023-08-19 — End: 2023-08-22

## 2023-08-19 MED ORDER — CHLORHEXIDINE GLUCONATE 0.12 % MT SOLN
15.0000 mL | Freq: Once | OROMUCOSAL | Status: AC
  Administered 2023-08-19: 15 mL via OROMUCOSAL

## 2023-08-19 MED ORDER — FENTANYL CITRATE PF 50 MCG/ML IJ SOSY
50.0000 ug | PREFILLED_SYRINGE | Freq: Once | INTRAMUSCULAR | Status: AC
  Administered 2023-08-19: 50 ug via INTRAVENOUS
  Filled 2023-08-19: qty 2

## 2023-08-19 MED ORDER — 0.9 % SODIUM CHLORIDE (POUR BTL) OPTIME
TOPICAL | Status: DC | PRN
  Administered 2023-08-19 (×2): 1000 mL

## 2023-08-19 MED ORDER — LEVOTHYROXINE SODIUM 50 MCG PO TABS
50.0000 ug | ORAL_TABLET | Freq: Every day | ORAL | Status: DC
  Administered 2023-08-20 – 2023-08-22 (×3): 50 ug via ORAL
  Filled 2023-08-19 (×3): qty 1

## 2023-08-19 MED ORDER — PROPOFOL 1000 MG/100ML IV EMUL
INTRAVENOUS | Status: AC
  Filled 2023-08-19: qty 100

## 2023-08-19 MED ORDER — SODIUM CHLORIDE (PF) 0.9 % IJ SOLN
INTRAMUSCULAR | Status: AC
  Filled 2023-08-19: qty 10

## 2023-08-19 MED ORDER — BUPIVACAINE LIPOSOME 1.3 % IJ SUSP
INTRAMUSCULAR | Status: AC
  Filled 2023-08-19: qty 20

## 2023-08-19 MED ORDER — FENTANYL CITRATE PF 50 MCG/ML IJ SOSY: 25.0000 ug | PREFILLED_SYRINGE | INTRAMUSCULAR | Status: DC | PRN

## 2023-08-19 MED ORDER — LOSARTAN POTASSIUM-HCTZ 100-25 MG PO TABS: 1.0000 | ORAL_TABLET | Freq: Every day | ORAL | Status: DC

## 2023-08-19 MED ORDER — ACETAMINOPHEN 10 MG/ML IV SOLN
1000.0000 mg | Freq: Four times a day (QID) | INTRAVENOUS | Status: DC
  Administered 2023-08-19: 1000 mg via INTRAVENOUS
  Filled 2023-08-19: qty 100

## 2023-08-19 MED ORDER — SODIUM CHLORIDE (PF) 0.9 % IJ SOLN
INTRAMUSCULAR | Status: AC
  Filled 2023-08-19: qty 50

## 2023-08-19 MED ORDER — MUPIROCIN 2 % EX OINT
1.0000 | TOPICAL_OINTMENT | Freq: Two times a day (BID) | CUTANEOUS | Status: DC
  Administered 2023-08-19 – 2023-08-21 (×5): 1 via NASAL
  Filled 2023-08-19 (×2): qty 22

## 2023-08-19 MED ORDER — LOSARTAN POTASSIUM 50 MG PO TABS
100.0000 mg | ORAL_TABLET | Freq: Every day | ORAL | Status: DC
  Administered 2023-08-20 – 2023-08-22 (×3): 100 mg via ORAL
  Filled 2023-08-19 (×3): qty 2

## 2023-08-19 MED ORDER — CHLORHEXIDINE GLUCONATE CLOTH 2 % EX PADS
6.0000 | MEDICATED_PAD | Freq: Every day | CUTANEOUS | Status: DC
  Administered 2023-08-20 – 2023-08-22 (×3): 6 via TOPICAL

## 2023-08-19 MED ORDER — MIDAZOLAM HCL 2 MG/2ML IJ SOLN
1.0000 mg | Freq: Once | INTRAMUSCULAR | Status: AC
  Administered 2023-08-19: 1 mg via INTRAVENOUS
  Filled 2023-08-19: qty 2

## 2023-08-19 MED ORDER — ONDANSETRON HCL 4 MG/2ML IJ SOLN
INTRAMUSCULAR | Status: AC
  Filled 2023-08-19: qty 2

## 2023-08-19 MED ORDER — POVIDONE-IODINE 10 % EX SWAB
2.0000 | Freq: Once | CUTANEOUS | Status: AC
  Administered 2023-08-19: 2 via TOPICAL

## 2023-08-19 MED ORDER — METOCLOPRAMIDE HCL 5 MG/ML IJ SOLN: 5.0000 mg | Freq: Three times a day (TID) | INTRAMUSCULAR | Status: DC | PRN

## 2023-08-19 MED ORDER — PHENOL 1.4 % MT LIQD: 1.0000 | OROMUCOSAL | Status: DC | PRN

## 2023-08-19 MED ORDER — BUPIVACAINE LIPOSOME 1.3 % IJ SUSP: 20.0000 mL | Freq: Once | INTRAMUSCULAR | Status: DC

## 2023-08-19 MED ORDER — SODIUM CHLORIDE 0.9 % IR SOLN
Status: DC | PRN
  Administered 2023-08-19: 1000 mL

## 2023-08-19 MED ORDER — DIPHENHYDRAMINE HCL 12.5 MG/5ML PO ELIX: 12.5000 mg | ORAL_SOLUTION | ORAL | Status: DC | PRN

## 2023-08-19 MED ORDER — ONDANSETRON HCL 4 MG PO TABS: 4.0000 mg | ORAL_TABLET | Freq: Four times a day (QID) | ORAL | Status: DC | PRN

## 2023-08-19 MED ORDER — FLEET ENEMA RE ENEM
1.0000 | ENEMA | Freq: Once | RECTAL | Status: DC | PRN
Start: 2023-08-19 — End: 2023-08-22

## 2023-08-19 MED ORDER — ACETAMINOPHEN 500 MG PO TABS
1000.0000 mg | ORAL_TABLET | Freq: Four times a day (QID) | ORAL | Status: AC
  Administered 2023-08-19 – 2023-08-20 (×3): 1000 mg via ORAL
  Filled 2023-08-19 (×3): qty 2

## 2023-08-19 MED ORDER — ACETAMINOPHEN 325 MG PO TABS
325.0000 mg | ORAL_TABLET | Freq: Four times a day (QID) | ORAL | Status: DC | PRN
  Administered 2023-08-21: 650 mg via ORAL
  Filled 2023-08-19: qty 2

## 2023-08-19 MED ORDER — ALBUTEROL SULFATE HFA 108 (90 BASE) MCG/ACT IN AERS: 2.0000 | INHALATION_SPRAY | Freq: Four times a day (QID) | RESPIRATORY_TRACT | Status: DC | PRN

## 2023-08-19 MED ORDER — TRAZODONE HCL 50 MG PO TABS
50.0000 mg | ORAL_TABLET | Freq: Every evening | ORAL | Status: DC | PRN
  Administered 2023-08-19 – 2023-08-20 (×2): 50 mg via ORAL
  Filled 2023-08-19 (×2): qty 1

## 2023-08-19 MED ORDER — CYCLOBENZAPRINE HCL 10 MG PO TABS
10.0000 mg | ORAL_TABLET | Freq: Three times a day (TID) | ORAL | Status: DC | PRN
  Administered 2023-08-19 – 2023-08-21 (×3): 10 mg via ORAL
  Filled 2023-08-19 (×3): qty 1

## 2023-08-19 MED ORDER — ASPIRIN 81 MG PO CHEW
81.0000 mg | CHEWABLE_TABLET | Freq: Two times a day (BID) | ORAL | Status: DC
  Administered 2023-08-20 – 2023-08-22 (×5): 81 mg via ORAL
  Filled 2023-08-19 (×5): qty 1

## 2023-08-19 MED ORDER — PROPOFOL 10 MG/ML IV BOLUS
INTRAVENOUS | Status: AC
  Filled 2023-08-19: qty 20

## 2023-08-19 MED ORDER — BISACODYL 10 MG RE SUPP: 10.0000 mg | Freq: Every day | RECTAL | Status: DC | PRN

## 2023-08-19 MED ORDER — BUPIVACAINE IN DEXTROSE 0.75-8.25 % IT SOLN
INTRATHECAL | Status: DC | PRN
  Administered 2023-08-19: 1.6 mL via INTRATHECAL

## 2023-08-19 MED ORDER — ATENOLOL 25 MG PO TABS
25.0000 mg | ORAL_TABLET | Freq: Once | ORAL | Status: AC
  Administered 2023-08-19: 25 mg via ORAL
  Filled 2023-08-19: qty 1

## 2023-08-19 MED ORDER — LACTATED RINGERS IV SOLN: INTRAVENOUS | Status: DC

## 2023-08-19 MED ORDER — ALBUTEROL SULFATE (2.5 MG/3ML) 0.083% IN NEBU
2.5000 mg | INHALATION_SOLUTION | Freq: Four times a day (QID) | RESPIRATORY_TRACT | Status: DC | PRN
  Administered 2023-08-19: 2.5 mg via RESPIRATORY_TRACT
  Filled 2023-08-19: qty 3

## 2023-08-19 MED ORDER — PROPOFOL 500 MG/50ML IV EMUL
INTRAVENOUS | Status: DC | PRN
  Administered 2023-08-19: 100 ug/kg/min via INTRAVENOUS

## 2023-08-19 MED ORDER — TRANEXAMIC ACID-NACL 1000-0.7 MG/100ML-% IV SOLN
1000.0000 mg | INTRAVENOUS | Status: AC
  Administered 2023-08-19: 1000 mg via INTRAVENOUS
  Filled 2023-08-19: qty 100

## 2023-08-19 MED ORDER — CEFAZOLIN SODIUM-DEXTROSE 2-4 GM/100ML-% IV SOLN
2.0000 g | Freq: Four times a day (QID) | INTRAVENOUS | Status: AC
  Administered 2023-08-19 (×2): 2 g via INTRAVENOUS
  Filled 2023-08-19 (×2): qty 100

## 2023-08-19 MED ORDER — MENTHOL 3 MG MT LOZG: 1.0000 | LOZENGE | OROMUCOSAL | Status: DC | PRN

## 2023-08-19 MED ORDER — SODIUM CHLORIDE (PF) 0.9 % IJ SOLN
INTRAMUSCULAR | Status: DC | PRN
  Administered 2023-08-19: 60 mL

## 2023-08-19 MED ORDER — PROPOFOL 500 MG/50ML IV EMUL
INTRAVENOUS | Status: AC
  Filled 2023-08-19: qty 50

## 2023-08-19 MED ORDER — ESCITALOPRAM OXALATE 10 MG PO TABS
10.0000 mg | ORAL_TABLET | Freq: Every day | ORAL | Status: DC
  Administered 2023-08-19 – 2023-08-22 (×4): 10 mg via ORAL
  Filled 2023-08-19 (×4): qty 1

## 2023-08-19 MED ORDER — HYDROMORPHONE HCL 1 MG/ML IJ SOLN
0.5000 mg | INTRAMUSCULAR | Status: DC | PRN
  Administered 2023-08-19: 1 mg via INTRAVENOUS
  Filled 2023-08-19: qty 1

## 2023-08-19 MED ORDER — GABAPENTIN 300 MG PO CAPS
300.0000 mg | ORAL_CAPSULE | Freq: Three times a day (TID) | ORAL | Status: DC
  Administered 2023-08-19 – 2023-08-22 (×9): 300 mg via ORAL
  Filled 2023-08-19 (×9): qty 1

## 2023-08-19 MED ORDER — POLYETHYLENE GLYCOL 3350 17 G PO PACK
17.0000 g | PACK | Freq: Every day | ORAL | Status: DC | PRN
Start: 2023-08-19 — End: 2023-08-22

## 2023-08-19 MED ORDER — CLONIDINE HCL (ANALGESIA) 100 MCG/ML EP SOLN
EPIDURAL | Status: DC | PRN
  Administered 2023-08-19: 50 ug

## 2023-08-19 MED ORDER — ONDANSETRON HCL 4 MG/2ML IJ SOLN: 4.0000 mg | Freq: Four times a day (QID) | INTRAMUSCULAR | Status: DC | PRN

## 2023-08-19 MED ORDER — ATENOLOL 50 MG PO TABS
25.0000 mg | ORAL_TABLET | Freq: Every day | ORAL | Status: DC
  Administered 2023-08-20 – 2023-08-22 (×3): 25 mg via ORAL
  Filled 2023-08-19 (×5): qty 1

## 2023-08-19 MED ORDER — BUPIVACAINE LIPOSOME 1.3 % IJ SUSP
INTRAMUSCULAR | Status: DC | PRN
  Administered 2023-08-19: 20 mL

## 2023-08-19 MED ORDER — ROPIVACAINE HCL 5 MG/ML IJ SOLN
INTRAMUSCULAR | Status: DC | PRN
  Administered 2023-08-19: 20 mL via PERINEURAL

## 2023-08-19 MED ORDER — DEXAMETHASONE SODIUM PHOSPHATE 10 MG/ML IJ SOLN
8.0000 mg | Freq: Once | INTRAMUSCULAR | Status: AC
  Administered 2023-08-19: 8 mg via INTRAVENOUS

## 2023-08-19 MED ORDER — AMPHETAMINE-DEXTROAMPHETAMINE 20 MG PO TABS: 20.0000 mg | ORAL_TABLET | ORAL | Status: DC

## 2023-08-19 MED ORDER — HYDROMORPHONE HCL 2 MG PO TABS
2.0000 mg | ORAL_TABLET | ORAL | Status: DC | PRN
  Administered 2023-08-19 – 2023-08-21 (×7): 4 mg via ORAL
  Administered 2023-08-22: 2 mg via ORAL
  Filled 2023-08-19 (×3): qty 2
  Filled 2023-08-19: qty 1
  Filled 2023-08-19 (×5): qty 2

## 2023-08-19 MED ORDER — DOCUSATE SODIUM 100 MG PO CAPS
100.0000 mg | ORAL_CAPSULE | Freq: Two times a day (BID) | ORAL | Status: DC
  Administered 2023-08-19 – 2023-08-22 (×6): 100 mg via ORAL
  Filled 2023-08-19 (×6): qty 1

## 2023-08-19 MED ORDER — METOCLOPRAMIDE HCL 5 MG PO TABS: 5.0000 mg | ORAL_TABLET | Freq: Three times a day (TID) | ORAL | Status: DC | PRN

## 2023-08-19 MED ORDER — PROPOFOL 10 MG/ML IV BOLUS
INTRAVENOUS | Status: DC | PRN
  Administered 2023-08-19: 10 mg via INTRAVENOUS
  Administered 2023-08-19 (×2): 20 mg via INTRAVENOUS

## 2023-08-19 MED ORDER — PHENYLEPHRINE HCL-NACL 20-0.9 MG/250ML-% IV SOLN
INTRAVENOUS | Status: DC | PRN
  Administered 2023-08-19: 25 ug/min via INTRAVENOUS

## 2023-08-19 MED ORDER — PANTOPRAZOLE SODIUM 20 MG PO TBEC
20.0000 mg | DELAYED_RELEASE_TABLET | Freq: Every day | ORAL | Status: DC
  Administered 2023-08-20 – 2023-08-22 (×3): 20 mg via ORAL
  Filled 2023-08-19 (×5): qty 1

## 2023-08-19 MED ORDER — AMISULPRIDE (ANTIEMETIC) 5 MG/2ML IV SOLN: 10.0000 mg | Freq: Once | INTRAVENOUS | Status: DC | PRN

## 2023-08-19 MED ORDER — ORAL CARE MOUTH RINSE: 15.0000 mL | Freq: Once | OROMUCOSAL | Status: AC

## 2023-08-19 MED ORDER — DEXAMETHASONE SODIUM PHOSPHATE 10 MG/ML IJ SOLN
10.0000 mg | Freq: Once | INTRAMUSCULAR | Status: DC
  Filled 2023-08-19: qty 1

## 2023-08-19 MED ORDER — ALPRAZOLAM 0.5 MG PO TABS
1.0000 mg | ORAL_TABLET | Freq: Three times a day (TID) | ORAL | Status: DC | PRN
  Administered 2023-08-19 – 2023-08-22 (×7): 1 mg via ORAL
  Filled 2023-08-19 (×7): qty 2

## 2023-08-19 MED ORDER — HYDROCHLOROTHIAZIDE 25 MG PO TABS
25.0000 mg | ORAL_TABLET | Freq: Every day | ORAL | Status: DC
  Administered 2023-08-20 – 2023-08-22 (×3): 25 mg via ORAL
  Filled 2023-08-19 (×3): qty 1

## 2023-08-19 MED ORDER — ONDANSETRON HCL 4 MG/2ML IJ SOLN
INTRAMUSCULAR | Status: DC | PRN
  Administered 2023-08-19: 4 mg via INTRAVENOUS

## 2023-08-19 MED ORDER — DEXAMETHASONE SODIUM PHOSPHATE 10 MG/ML IJ SOLN
INTRAMUSCULAR | Status: AC
  Filled 2023-08-19: qty 1

## 2023-08-19 MED ORDER — CEFAZOLIN SODIUM-DEXTROSE 2-4 GM/100ML-% IV SOLN
2.0000 g | INTRAVENOUS | Status: AC
  Administered 2023-08-19: 2 g via INTRAVENOUS
  Filled 2023-08-19: qty 100

## 2023-08-19 MED ORDER — OXYCODONE HCL 5 MG PO TABS
15.0000 mg | ORAL_TABLET | Freq: Four times a day (QID) | ORAL | Status: DC | PRN
  Administered 2023-08-19 – 2023-08-22 (×12): 15 mg via ORAL
  Filled 2023-08-19 (×12): qty 3

## 2023-08-19 MED ORDER — AMITRIPTYLINE HCL 50 MG PO TABS
50.0000 mg | ORAL_TABLET | Freq: Every day | ORAL | Status: DC
Start: 2023-08-19 — End: 2023-08-22
  Administered 2023-08-19 – 2023-08-21 (×3): 50 mg via ORAL
  Filled 2023-08-19 (×3): qty 1

## 2023-08-19 SURGICAL SUPPLY — 59 items
ATTUNE DIST FEM SZ5 4 KNEE (Miscellaneous) IMPLANT
ATTUNE MED DOME PAT 38 KNEE (Knees) IMPLANT
BAG COUNTER SPONGE SURGICOUNT (BAG) IMPLANT
BAG ZIPLOCK 12X15 (MISCELLANEOUS) IMPLANT
BLADE SAG 18X100X1.27 (BLADE) ×2 IMPLANT
BLADE SAW SGTL 11.0X1.19X90.0M (BLADE) ×2 IMPLANT
BLADE SURG SZ10 CARB STEEL (BLADE) IMPLANT
BNDG ELASTIC 6INX 5YD STR LF (GAUZE/BANDAGES/DRESSINGS) ×2 IMPLANT
BNDG ELASTIC 6X10 VLCR STRL LF (GAUZE/BANDAGES/DRESSINGS) IMPLANT
BONE CEMENT GENTAMICIN (Cement) ×3 IMPLANT
CEMENT BONE GENTAMICIN 40 (Cement) ×6 IMPLANT
COMP FEM ATTUNE CRS SZ5 LT (Femur) ×1 IMPLANT
COMPONENT FEM ATN CRS SZ5 LT (Femur) IMPLANT
COVER SURGICAL LIGHT HANDLE (MISCELLANEOUS) ×2 IMPLANT
CUFF TRNQT CYL 34X4.125X (TOURNIQUET CUFF) ×2 IMPLANT
DERMABOND ADVANCED .7 DNX12 (GAUZE/BANDAGES/DRESSINGS) IMPLANT
DRAPE U-SHAPE 47X51 STRL (DRAPES) ×2 IMPLANT
DRSG AQUACEL AG ADV 3.5X10 (GAUZE/BANDAGES/DRESSINGS) ×2 IMPLANT
DURAPREP 26ML APPLICATOR (WOUND CARE) ×2 IMPLANT
ELECT REM PT RETURN 15FT ADLT (MISCELLANEOUS) ×2 IMPLANT
GAUZE XEROFORM 1X8 LF (GAUZE/BANDAGES/DRESSINGS) IMPLANT
GLOVE BIO SURGEON STRL SZ 6.5 (GLOVE) IMPLANT
GLOVE BIO SURGEON STRL SZ7 (GLOVE) IMPLANT
GLOVE BIO SURGEON STRL SZ8 (GLOVE) ×4 IMPLANT
GLOVE BIOGEL PI IND STRL 7.0 (GLOVE) IMPLANT
GLOVE BIOGEL PI IND STRL 8 (GLOVE) ×2 IMPLANT
GOWN STRL REUS W/ TWL LRG LVL3 (GOWN DISPOSABLE) ×2 IMPLANT
HOLDER FOLEY CATH W/STRAP (MISCELLANEOUS) IMPLANT
IMMOBILIZER KNEE 20 (SOFTGOODS) ×1
IMMOBILIZER KNEE 20 THIGH 36 (SOFTGOODS) ×2 IMPLANT
INSERT TIB CMT ATTUNE RP SZ4 (Knees) IMPLANT
INSERT TIB CRS ATTUNE SZ5 22 (Insert) IMPLANT
KIT TURNOVER KIT A (KITS) IMPLANT
MANIFOLD NEPTUNE II (INSTRUMENTS) ×2 IMPLANT
NS IRRIG 1000ML POUR BTL (IV SOLUTION) ×2 IMPLANT
PACK TOTAL KNEE CUSTOM (KITS) ×2 IMPLANT
PADDING CAST COTTON 6X4 STRL (CAST SUPPLIES) ×4 IMPLANT
PIN STEINMAN FIXATION KNEE (PIN) IMPLANT
PROTECTOR NERVE ULNAR (MISCELLANEOUS) ×2 IMPLANT
RESTRICTOR CEMENT SZ 5 C-STEM (Cement) IMPLANT
SET HNDPC FAN SPRY TIP SCT (DISPOSABLE) ×2 IMPLANT
SLEEVE TIB ATTUNE FP 37 (Knees) IMPLANT
SPIKE FLUID TRANSFER (MISCELLANEOUS) IMPLANT
STAPLER SKIN PROX WIDE 3.9 (STAPLE) IMPLANT
STEM REV CEMENTED 14X50MM (Stem) IMPLANT
STEM STR ATTUNE PF 16X110 (Knees) IMPLANT
STRIP CLOSURE SKIN 1/2X4 (GAUZE/BANDAGES/DRESSINGS) ×4 IMPLANT
SUT MNCRL AB 4-0 PS2 18 (SUTURE) ×2 IMPLANT
SUT STRATAFIX 0 PDS 27 VIOLET (SUTURE) ×1
SUT VIC AB 2-0 CT1 TAPERPNT 27 (SUTURE) ×6 IMPLANT
SUTURE STRATFX 0 PDS 27 VIOLET (SUTURE) ×2 IMPLANT
SWAB COLLECTION DEVICE MRSA (MISCELLANEOUS) IMPLANT
SWAB CULTURE ESWAB REG 1ML (MISCELLANEOUS) IMPLANT
TOWER CARTRIDGE SMART MIX (DISPOSABLE) ×2 IMPLANT
TRAY FOLEY MTR SLVR 16FR STAT (SET/KITS/TRAYS/PACK) ×2 IMPLANT
TUBE KAMVAC SUCTION (TUBING) IMPLANT
TUBE SUCTION HIGH CAP CLEAR NV (SUCTIONS) ×2 IMPLANT
WATER STERILE IRR 1000ML POUR (IV SOLUTION) ×2 IMPLANT
WRAP KNEE MAXI GEL POST OP (GAUZE/BANDAGES/DRESSINGS) ×2 IMPLANT

## 2023-08-19 NOTE — Plan of Care (Signed)
  Problem: Education: Goal: Knowledge of General Education information will improve Description: Including pain rating scale, medication(s)/side effects and non-pharmacologic comfort measures Outcome: Progressing   Problem: Activity: Goal: Risk for activity intolerance will decrease Outcome: Progressing   Problem: Safety: Goal: Ability to remain free from injury will improve Outcome: Progressing   Problem: Pain Management: Goal: General experience of comfort will improve Outcome: Progressing

## 2023-08-19 NOTE — Op Note (Signed)
Brooke Ward, Brooke Ward MEDICAL RECORD NO: [id number] ACCOUNT NO: [id number] DATE OF BIRTH: 05-19-1966 FACILITY: [facility] LOCATION: [id number] PHYSICIAN: Gus Rankin. 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  PROCEDURE:  Left total knee arthroplasty revision.  SURGEON:  Gus Rankin. Gwyndolyn Guilford, MD  ASSISTANT:  Arcola Jansky, PA-C  ANESTHESIA:  Adductor canal block and spinal.  ESTIMATED BLOOD LOSS:  20 mL  DRAINS:  None.  TOURNIQUET TIME:  72 minutes at 300 mmHg.  COMPLICATIONS:  None.  CONDITION:  Stable to recovery  BRIEF CLINICAL NOTE:  The patient is a 57 year old female who had a left total knee arthroplasty done about 20 years ago and had done well until the past couple of years when she has had progressively worsening pain and instability.  She presented to me  several months ago with painful unstable total knee.  She had a negative infection workup.  She had radiographic evidence of migration of the tibial component as well as valgus deformity of the knee with gross instability.  She presents now for left  total knee arthroplasty revision.  PROCEDURE IN DETAIL:  After successful administration of adductor canal block and spinal, a tourniquet was placed on her left thigh and left lower extremity prepped and draped in the usual sterile fashion.  Extremities were wrapped in Esmarch and  tourniquet inflated to 300 mmHg.  Midline incision was made with a 10 blade through subcutaneous tissue to the level of the extensor mechanism.  Fresh blade was used to make a medial parapatellar arthrotomy.  There was a minimal amount of fluid in the  knee, but there was a tremendous amount of metal stained synovium.  I did a thorough synovectomy medial, lateral, and infrapatellar regions.  The soft tissue  in the proximal medial tibia was then subperiosteally elevated to the joint line with a knife  and into the semimembranosus bursa with a Cobb elevator.  Soft tissue laterally was elevated with attention being paid to avoiding a patellar tendon on the tibial tubercle.  The patella was everted and knee flexed 90 degrees.  The polyethylene was  tremendously worn and completely broken through medially.  There were sheets of polyethylene throughout the joint, which were removed.  I was able to take the polyethylene out of the tibial tray and the tibial tray was grossly loose.  I was easily able  to remove that.  We gained circumferential retraction around the tibia and retracted it forward.  The extramedullary tibial alignment guide was placed referencing proximally at the medial aspect of the tibial tubercle and distally along the second  metatarsal axis and tibial crest.  The block was pinned to remove about 2 mm off of the previous resection level.  Tibial resection was made with an oscillating saw.  Size 4 is the most appropriate tibial component.  I then removed the cement from the  canal and metaphyseal region of the tibia.  Once the cement was removed, I reamed up to 14 mm for a 14 mm cemented stem.  This was a 14 x 50 stem.  We then prepared proximally for the size 4 tray.  The proximal reaming was performed.  We then broached up  to a 37 sleeve and 37 had great rotation and axial control, so we decided to use the porous-coated 37 sleeve.  Tibial preparation was  then done.    On the femoral side, osteotomes were used to disrupt the interface between the femoral component and bone.  Femoral component was also easily removed with minimal to no bone loss.  The femoral canal was accessed and thoroughly irrigated.  Reaming was  performed up to 16 mm for a 16 x 110 press-fit stem.  Size 5 was the most appropriate femoral component.  With the stem in place as our intramedullary alignment guide, we put the  distal femoral cutting block and removed about 2 mm off the distal femur.   I decided to put 4 mm augments medial and lateral.  The size 5 AP cutting block was then placed and rotation was marked off the epicondylar axis confirmed by creating a rectangular flexion gap at 90 degrees with a spacer block in place.  Anterior,  posterior, and chamfer cuts were attempted and no bone was removed as there was already bone loss.  The intercondylar extensions placed and the intercondylar cut made.    The trials were placed.  On a tibial size, a size 4 Attune revision tibia with a 37 sleeve and a 14 x 50 cemented stem.  Note that I had already sized for a size 5 cement restrictor and a cement restrictor was placed at the appropriate depth in the  tibial canal.  On the femoral side, the trial is a size 5 Attune revision femur with 4 mm augments distally medial and lateral, a 16 x 110 stem.  The insert ended up being up to 22 mm in order to get full extension with excellent varus, valgus, and  anterior and posterior balance throughout full range of motion.  The patella was a mobile-bearing metal back patella, so I had to remove that.  There was some osteolysis centrally and I felt that I was going to have to use some of the bone I resected  from the intercondylar cut as a bone graft into the patella.  I did not graft at that point, but I knew she was going to need a 38 patella based on the templating size and we did drill one of the holes for the patella as a place marker as the bone on  that section was good.  The other two holes were going to be drilled after I bone grafted.    The trials were removed and the cut bone surface was prepared with pulsatile lavage.  The components were assembled on the back table and once ready, the three batches of gentamicin impregnated cement were mixed.  The cement was injected into the tibial  canal just at the diaphyseal portion as the metaphyseal area was left without cement  because of the porous-coated sleeve.  On the tibial side, we cemented proximally, had the porous-coated sleeve cemented distally.  The components impacted and the  extruded cement was removed.  This had a great fit on the tibia.  The trial 22 mm insert was placed and then I cemented on the femoral side distally with the press-fit stem.  The femoral component was impacted and all extruded cement removed.  Knees held  in full extension.  We then took some of the graft and I morselized it and placed it into the defect in the patella.  Then, I placed the patella template again and drilled lug holes into this.  We cemented the 38 patella in place and held it with the  clamp.  When the cement was fully hardened, then the trial  inserts removed and the permanent 22 mm revision insert for the Attune mobile bearing system is placed.  It had great stability with excellent varus and valgus stability in extension and AP  stability in flexion.  Patella clamps were removed and the patella tracks normally.  Wound was copiously irrigated with saline solution and the arthrotomy was closed with a running 0 Stratafix suture.  Tourniquet was released.  Total tourniquet times 72  minutes.  Subcutaneous was then closed with interrupted 2-0 Vicryl and the skin closed with staples.  The incision was clean and dry and a sterile dressing applied.  She is awakened and transported to the recovery in stable condition.  Note that a  surgical assistance is medical necessity for this procedure to do it in a safe and expeditious manner.  Surgical assistance necessary for retraction of vital ligaments and neurovascular structures and for proper positioning of the limb for safe removal  of the old implant and safe and accurate placement of the new implant.   VAI D: 08/19/2023 5:03:22 pm T: 08/19/2023 9:28:00 pm  JOB: [id number]/ [id number]

## 2023-08-19 NOTE — Anesthesia Procedure Notes (Signed)
Anesthesia Regional Block: Adductor canal block   Pre-Anesthetic Checklist: , timeout performed,  Correct Patient, Correct Site, Correct Laterality,  Correct Procedure, Correct Position, site marked,  Risks and benefits discussed,  Surgical consent,  Pre-op evaluation,  At surgeon's request and post-op pain management  Laterality: Left  Prep: chloraprep       Needles:  Injection technique: Single-shot  Needle Type: Echogenic Needle     Needle Length: 9cm  Needle Gauge: 21     Additional Needles:   Procedures:,,,, ultrasound used (permanent image in chart),,    Narrative:  Start time: 08/19/2023 8:55 AM End time: 08/19/2023 9:01 AM Injection made incrementally with aspirations every 5 mL.  Performed by: Personally  Anesthesiologist: Marcene Duos, MD

## 2023-08-19 NOTE — Anesthesia Preprocedure Evaluation (Addendum)
Anesthesia Evaluation  Patient identified by MRN, date of birth, ID band Patient awake    Reviewed: Allergy & Precautions, NPO status , Patient's Chart, lab work & pertinent test results  Airway Mallampati: II  TM Distance: >3 FB Neck ROM: Full    Dental   Pulmonary asthma    breath sounds clear to auscultation       Cardiovascular hypertension, Pt. on medications and Pt. on home beta blockers  Rhythm:Regular Rate:Normal     Neuro/Psych negative neurological ROS     GI/Hepatic Neg liver ROS,GERD  Medicated,,  Endo/Other  Hypothyroidism  Class 3 obesity  Renal/GU negative Renal ROS     Musculoskeletal  (+) Arthritis ,    Abdominal   Peds  Hematology negative hematology ROS (+)   Anesthesia Other Findings   Reproductive/Obstetrics                             Anesthesia Physical Anesthesia Plan  ASA: 3  Anesthesia Plan: Spinal   Post-op Pain Management: Regional block* and Ofirmev IV (intra-op)*   Induction:   PONV Risk Score and Plan: 2 and Dexamethasone, Ondansetron, Treatment may vary due to age or medical condition and Propofol infusion  Airway Management Planned: Natural Airway and Mask  Additional Equipment: None  Intra-op Plan:   Post-operative Plan:   Informed Consent: I have reviewed the patients History and Physical, chart, labs and discussed the procedure including the risks, benefits and alternatives for the proposed anesthesia with the patient or authorized representative who has indicated his/her understanding and acceptance.     Dental advisory given  Plan Discussed with: CRNA  Anesthesia Plan Comments:        Anesthesia Quick Evaluation

## 2023-08-19 NOTE — Interval H&P Note (Signed)
History and Physical Interval Note:  08/19/2023 8:37 AM  Dixon Boos  has presented today for surgery, with the diagnosis of failed left total knee arthroplasty.  The various methods of treatment have been discussed with the patient and family. After consideration of risks, benefits and other options for treatment, the patient has consented to  Procedure(s): TOTAL KNEE REVISION (Left) as a surgical intervention.  The patient's history has been reviewed, patient examined, no change in status, stable for surgery.  I have reviewed the patient's chart and labs.  Questions were answered to the patient's satisfaction.     Homero Fellers Oneal Biglow

## 2023-08-19 NOTE — Evaluation (Signed)
Physical Therapy Evaluation Patient Details Name: Brooke Ward MRN: [id number] DOB: [birth year] Today's Date: 08/19/2023  History of Present Illness  57 yo female presents to therapy s/p L TKA revision on 08/19/2023 due to failure of conservative measures. Pt PMH includes but is not limited to: anxiety, arthritis, asthma, depression, GERD, HTN, hypothyroidism, L wrist surgery, R TKA and L reverse THA (2023).  Clinical Impression   Brooke Ward is a 57 y.o. female POD 0 s/p L TKA. Patient reports IND for gait and requiring some assist with ADLs at baseline. Patient is now limited by functional impairments (see PT problem list below) and requires CGA for bed mobility and CGA for transfers. Patient was able to ambulate 18 feet with RW and CGA level of assist. Patient instructed in exercise to facilitate ROM and circulation to manage edema. Patient will benefit from continued skilled PT interventions to address impairments and progress towards PLOF. Acute PT will follow to progress mobility and stair training in preparation for safe discharge home with family support and OPPT services/       If plan is discharge home, recommend the following:     Can travel by private vehicle        Equipment Recommendations Rolling walker (2 wheels);BSC/3in1  Recommendations for Other Services       Functional Status Assessment Patient has had a recent decline in their functional status and demonstrates the ability to make significant improvements in function in a reasonable and predictable amount of time.     Precautions / Restrictions Precautions Precautions: Knee;Fall Restrictions Weight Bearing Restrictions: No      Mobility  Bed Mobility Overal bed mobility: Needs Assistance Bed Mobility: Supine to Sit     Supine to sit: Contact guard     General bed mobility comments: min cues and incresed time    Transfers Overall transfer level: Needs assistance Equipment used: Rolling walker (2  wheels) Transfers: Sit to/from Stand Sit to Stand: Contact guard assist           General transfer comment: min cues    Ambulation/Gait Ambulation/Gait assistance: Contact guard assist Gait Distance (Feet): 18 Feet Assistive device: Rolling walker (2 wheels) Gait Pattern/deviations: Step-to pattern, Antalgic, Trunk flexed Gait velocity: decreased     General Gait Details: heavy reliance on B UE support at RW to offload L LE in stance phase with min cues and encouragement. pt indicated she did not want to over do it  Careers information officer     Tilt Bed    Modified Rankin (Stroke Patients Only)       Balance Overall balance assessment: Needs assistance, History of Falls (1 in past 6 months) Sitting-balance support: Feet supported Sitting balance-Leahy Scale: Good     Standing balance support: Bilateral upper extremity supported, During functional activity, Reliant on assistive device for balance Standing balance-Leahy Scale: Poor                               Pertinent Vitals/Pain Pain Assessment Pain Assessment: 0-10 Pain Score: 3  Pain Location: L knee Pain Descriptors / Indicators: Aching, Discomfort, Throbbing, Grimacing, Operative site guarding Pain Intervention(s): Limited activity within patient's tolerance, Monitored during session, Premedicated before session, Repositioned, Ice applied    Home Living Family/patient expects to be discharged to:: Private residence Living Arrangements: Other relatives;Children Available Help at Discharge: Family (neice and daughter) Type  of Home: Apartment Home Access: Stairs to enter Entrance Stairs-Rails: None Entrance Stairs-Number of Steps: 1 + 1   Home Layout: One level   Additional Comments: PCA 6 x wk 3 hr/day assists with showing, dressing and household management    Prior Function Prior Level of Function : Needs assist       Physical Assist : ADLs (physical)      Mobility Comments: pt reports IND with no AD for all mobility       Extremity/Trunk Assessment        Lower Extremity Assessment Lower Extremity Assessment: LLE deficits/detail LLE Deficits / Details: ankle DF/PF 5/5; SLR < 10 degree lag LLE Sensation: WNL    Cervical / Trunk Assessment Cervical / Trunk Assessment: Normal  Communication   Communication Communication: No apparent difficulties  Cognition Arousal: Alert Behavior During Therapy: WFL for tasks assessed/performed Overall Cognitive Status: Within Functional Limits for tasks assessed                                          General Comments      Exercises Total Joint Exercises Ankle Circles/Pumps: AROM, Both, 10 reps   Assessment/Plan    PT Assessment Patient needs continued PT services  PT Problem List Decreased strength;Decreased range of motion;Decreased activity tolerance;Decreased balance;Decreased mobility;Decreased coordination;Pain       PT Treatment Interventions DME instruction;Gait training;Stair training;Functional mobility training;Therapeutic activities;Therapeutic exercise;Neuromuscular re-education;Balance training;Patient/family education;Modalities    PT Goals (Current goals can be found in the Care Plan section)  Acute Rehab PT Goals Patient Stated Goal: to be able to continue to loose weight and get back into the gym PT Goal Formulation: With patient Time For Goal Achievement: 09/02/23 Potential to Achieve Goals: Good    Frequency 7X/week     Co-evaluation               AM-PAC PT "6 Clicks" Mobility  Outcome Measure Help needed turning from your back to your side while in a flat bed without using bedrails?: A Little Help needed moving from lying on your back to sitting on the side of a flat bed without using bedrails?: A Little Help needed moving to and from a bed to a chair (including a wheelchair)?: A Little Help needed standing up from a chair using  your arms (e.g., wheelchair or bedside chair)?: A Little Help needed to walk in hospital room?: A Little Help needed climbing 3-5 steps with a railing? : A Lot 6 Click Score: 17    End of Session Equipment Utilized During Treatment: Gait belt Activity Tolerance: Patient limited by fatigue Patient left: in chair;with call bell/phone within reach;with chair alarm set;with family/visitor present Nurse Communication: Mobility status PT Visit Diagnosis: Unsteadiness on feet (R26.81);Other abnormalities of gait and mobility (R26.89);Muscle weakness (generalized) (M62.81);Difficulty in walking, not elsewhere classified (R26.2);Pain;History of falling (Z91.81) Pain - Right/Left: Left Pain - part of body: Knee;Leg    Time: 2130-8657 PT Time Calculation (min) (ACUTE ONLY): 38 min   Charges:   PT Evaluation $PT Eval Low Complexity: 1 Low PT Treatments $Gait Training: 8-22 mins $Therapeutic Activity: 8-22 mins PT General Charges $$ ACUTE PT VISIT: 1 Visit         Johnny Bridge, PT Acute Rehab   Jacqualyn Posey 08/19/2023, 6:10 PM

## 2023-08-19 NOTE — Progress Notes (Signed)
Orthopedic Tech Progress Note Patient Details:  Brooke Ward 1966-03-11 [id number]  CPM Left Knee CPM Left Knee: On Left Knee Flexion (Degrees): 40 Left Knee Extension (Degrees): 10  Post Interventions Patient Tolerated: Well Instructions Provided: Adjustment of device, Care of device  Kizzie Fantasia 08/19/2023, 1:49 PM

## 2023-08-19 NOTE — Brief Op Note (Signed)
08/19/2023  4:52 PM  PATIENT:  Dixon Boos  57 y.o. female  PRE-OPERATIVE DIAGNOSIS:  failed left total knee arthroplasty  POST-OPERATIVE DIAGNOSIS:  failed left total knee arthroplasty  PROCEDURE:  Procedure(s): TOTAL KNEE REVISION (Left)  SURGEON:  Surgeons and Role:    Ollen Gross, MD - Primary  PHYSICIAN ASSISTANT:   ASSISTANTS: Arcola Jansky, PA-C   ANESTHESIA:    Adductor canal block and spinal  EBL:  20 mL   BLOOD ADMINISTERED:none  DRAINS: none   LOCAL MEDICATIONS USED:  OTHER Exparel  COUNTS:  YES  TOURNIQUET:   Total Tourniquet Time Documented: Thigh (Left) - 73 minutes Total: Thigh (Left) - 73 minutes   DICTATION: .Other Dictation: Dictation Number [id number]  PLAN OF CARE: Admit to inpatient   PATIENT DISPOSITION:  PACU - hemodynamically stable. [id number]

## 2023-08-19 NOTE — Anesthesia Procedure Notes (Signed)
Spinal  Patient location during procedure: OR Start time: 08/19/2023 10:11 AM End time: 08/19/2023 10:13 AM Reason for block: surgical anesthesia Staffing Performed: resident/CRNA  Anesthesiologist: Marcene Duos, MD Resident/CRNA: Doran Clay, CRNA Performed by: Doran Clay, CRNA Authorized by: Marcene Duos, MD   Preanesthetic Checklist Completed: patient identified, IV checked, site marked, risks and benefits discussed, surgical consent, monitors and equipment checked, pre-op evaluation and timeout performed Spinal Block Patient position: sitting Prep: DuraPrep Patient monitoring: heart rate, cardiac monitor, continuous pulse ox and blood pressure Approach: midline Location: L3-4 Injection technique: single-shot Needle Needle type: Pencan  Needle gauge: 24 G Needle length: 10 cm Needle insertion depth: 9 cm Assessment Sensory level: T6 Events: CSF return Additional Notes Timeout performed. Patient in sitting position. L3-4 identified. Cleansed with Duraprep. SAB without difficulty. To supine position.

## 2023-08-19 NOTE — Anesthesia Postprocedure Evaluation (Signed)
Anesthesia Post Note  Patient: Brooke Ward  Procedure(s) Performed: TOTAL KNEE REVISION (Left: Knee)     Patient location during evaluation: PACU Anesthesia Type: Spinal Level of consciousness: awake and alert Pain management: pain level controlled Vital Signs Assessment: post-procedure vital signs reviewed and stable Respiratory status: spontaneous breathing and respiratory function stable Cardiovascular status: blood pressure returned to baseline and stable Postop Assessment: spinal receding Anesthetic complications: no  No notable events documented.  Last Vitals:  Vitals:   08/19/23 1516 08/19/23 1647  BP: (!) 140/74   Pulse: 73   Resp: 16   Temp: 36.5 C   SpO2: 96% 98%    Last Pain:  Vitals:   08/19/23 1700  TempSrc:   PainSc: 3                  Kennieth Rad

## 2023-08-19 NOTE — Transfer of Care (Signed)
Immediate Anesthesia Transfer of Care Note  Patient: Brooke Ward  Procedure(s) Performed: TOTAL KNEE REVISION (Left: Knee)  Patient Location: PACU  Anesthesia Type:Spinal  Level of Consciousness: awake  Airway & Oxygen Therapy: Patient Spontanous Breathing and Patient connected to face mask oxygen  Post-op Assessment: Report given to RN and Post -op Vital signs reviewed and stable  Post vital signs: Reviewed and stable  Last Vitals:  Vitals Value Taken Time  BP    Temp    Pulse    Resp 17 08/19/23 1218  SpO2    Vitals shown include unfiled device data.  Last Pain:  Vitals:   08/19/23 0908  TempSrc:   PainSc: 0-No pain         Complications: No notable events documented.

## 2023-08-19 NOTE — Discharge Instructions (Signed)
Brooke Gross, MD Total Joint Specialist [facility] [address] [place], [place] [address] [phone]  TOTAL KNEE REVISION POSTOPERATIVE DIRECTIONS  Knee Rehabilitation, Guidelines Following Surgery  Results after knee surgery are often greatly improved when you follow the exercise, range of motion and muscle strengthening exercises prescribed by your doctor. Safety measures are also important to protect the knee from further injury. If any of these exercises cause you to have increased pain or swelling in your knee joint, decrease the amount until you are comfortable again and slowly increase them. If you have problems or questions, call your caregiver or physical therapist for advice.   BLOOD CLOT PREVENTION Take an 81 mg Aspirin two times a day for three weeks following surgery. Then resume one 81 mg Aspirin once a day. You may resume your vitamins/supplements upon discharge from the hospital. Do not take any NSAIDs (Advil, Aleve, Ibuprofen, Meloxicam, etc.) until you have discontinued the 81 mg Aspirin twice a day.  GABAPENTIN INSTRUCTIONS Take a 300 mg capsule three times a day for two weeks following surgery.Then take a 300 mg capsule two times a day for two weeks. Then take a 300 mg capsule once a day for two weeks. Then discontinue.  HOME CARE INSTRUCTIONS  Remove items at home which could result in a fall. This includes throw rugs or furniture in walking pathways.  ICE to the affected knee as much as tolerated. Icing helps control swelling. If the swelling is well controlled you will be more comfortable and rehab easier. Continue to use ice on the knee for pain and swelling from surgery. You may notice swelling that will progress down to the foot and ankle. This is normal after surgery. Elevate the leg when you are not up walking on it.    Continue to use the breathing machine which will help keep your temperature down. It is common for your  temperature to cycle up and down following surgery, especially at night when you are not up moving around and exerting yourself. The breathing machine keeps your lungs expanded and your temperature down. Do not place pillow under the operative knee, focus on keeping the knee straight while resting  DIET You may resume your previous home diet once you are discharged from the hospital.  DRESSING / WOUND CARE / SHOWERING Keep your bulky bandage on for 2 days. On the third post-operative day you may remove the Ace bandage and gauze. There is a waterproof adhesive bandage on your skin which will stay in place until your first follow-up appointment. Once you remove this you will not need to place another bandage You may begin showering 3 days following surgery, but do not submerge the incision under water.  ACTIVITY For the first 5 days, the key is rest and control of pain and swelling Do your home exercises twice a day starting on post-operative day 3. On the days you go to physical therapy, just do the home exercises once that day. You should rest, ice and elevate the leg for 50 minutes out of every hour. Get up and walk/stretch for 10 minutes per hour. After 5 days you can increase your activity slowly as tolerated. Walk with your walker as instructed. Use the walker until you are comfortable transitioning to a cane. Walk with the cane in the opposite hand of the operative leg. You may discontinue the cane once you are comfortable and walking steadily. Avoid periods of inactivity such as sitting longer than an  hour when not asleep. This helps prevent blood clots.  You may discontinue the knee immobilizer once you are able to perform a straight leg raise while lying down. You may resume a sexual relationship in one month or when given the OK by your doctor.  You may return to work once you are cleared by your doctor.  Do not drive a car for 6 weeks or until released by your surgeon.  Do not drive  while taking narcotics.  TED HOSE STOCKINGS Wear the elastic stockings on both legs for three weeks following surgery during the day. You may remove them at night for sleeping.  WEIGHT BEARING Weight bearing as tolerated with assist device (walker, cane, etc) as directed, use it as long as suggested by your surgeon or therapist, typically at least 4-6 weeks.  POSTOPERATIVE CONSTIPATION PROTOCOL Constipation - defined medically as fewer than three stools per week and severe constipation as less than one stool per week.  One of the most common issues patients have following surgery is constipation.  Even if you have a regular bowel pattern at home, your normal regimen is likely to be disrupted due to multiple reasons following surgery.  Combination of anesthesia, postoperative narcotics, change in appetite and fluid intake all can affect your bowels.  In order to avoid complications following surgery, here are some recommendations in order to help you during your recovery period.  Colace (docusate) - Pick up an over-the-counter form of Colace or another stool softener and take twice a day as long as you are requiring postoperative pain medications.  Take with a full glass of water daily.  If you experience loose stools or diarrhea, hold the colace until you stool forms back up. If your symptoms do not get better within 1 week or if they get worse, check with your doctor. Dulcolax (bisacodyl) - Pick up over-the-counter and take as directed by the product packaging as needed to assist with the movement of your bowels.  Take with a full glass of water.  Use this product as needed if not relieved by Colace only.  MiraLax (polyethylene glycol) - Pick up over-the-counter to have on hand. MiraLax is a solution that will increase the amount of water in your bowels to assist with bowel movements.  Take as directed and can mix with a glass of water, juice, soda, coffee, or tea. Take if you go more than two days  without a movement. Do not use MiraLax more than once per day. Call your doctor if you are still constipated or irregular after using this medication for 7 days in a row.  If you continue to have problems with postoperative constipation, please contact the office for further assistance and recommendations.  If you experience "the worst abdominal pain ever" or develop nausea or vomiting, please contact the office immediatly for further recommendations for treatment.  ITCHING If you experience itching with your medications, try taking only a single pain pill, or even half a pain pill at a time.  You can also use Benadryl over the counter for itching or also to help with sleep.   MEDICATIONS See your medication summary on the "After Visit Summary" that the nursing staff will review with you prior to discharge.  You may have some home medications which will be placed on hold until you complete the course of blood thinner medication.  It is important for you to complete the blood thinner medication as prescribed by your surgeon.  Continue your approved medications  as instructed at time of discharge.  PRECAUTIONS If you experience chest pain or shortness of breath - call 911 immediately for transfer to the hospital emergency department.  If you develop a fever greater that 101 F, purulent drainage from wound, increased redness or drainage from wound, foul odor from the wound/dressing, or calf pain - CONTACT YOUR SURGEON.                                                   FOLLOW-UP APPOINTMENTS Make sure you keep all of your appointments after your operation with your surgeon and caregivers. You should call the office at the above phone number and make an appointment for approximately two weeks after the date of your surgery or on the date instructed by your surgeon outlined in the "After Visit Summary".  RANGE OF MOTION AND STRENGTHENING EXERCISES  Rehabilitation of the knee is important following a knee  injury or an operation. After just a few days of immobilization, the muscles of the thigh which control the knee become weakened and shrink (atrophy). Knee exercises are designed to build up the tone and strength of the thigh muscles and to improve knee motion. Often times heat used for twenty to thirty minutes before working out will loosen up your tissues and help with improving the range of motion but do not use heat for the first two weeks following surgery. These exercises can be done on a training (exercise) mat, on the floor, on a table or on a bed. Use what ever works the best and is most comfortable for you Knee exercises include:  Leg Lifts - While your knee is still immobilized in a splint or cast, you can do straight leg raises. Lift the leg to 60 degrees, hold for 3 sec, and slowly lower the leg. Repeat 10-20 times 2-3 times daily. Perform this exercise against resistance later as your knee gets better.  Quad and Hamstring Sets - Tighten up the muscle on the front of the thigh (Quad) and hold for 5-10 sec. Repeat this 10-20 times hourly. Hamstring sets are done by pushing the foot backward against an object and holding for 5-10 sec. Repeat as with quad sets.  Leg Slides: Lying on your back, slowly slide your foot toward your buttocks, bending your knee up off the floor (only go as far as is comfortable). Then slowly slide your foot back down until your leg is flat on the floor again. Angel Wings: Lying on your back spread your legs to the side as far apart as you can without causing discomfort.  A rehabilitation program following serious knee injuries can speed recovery and prevent re-injury in the future due to weakened muscles. Contact your doctor or a physical therapist for more information on knee rehabilitation.   POST-OPERATIVE OPIOID TAPER INSTRUCTIONS: It is important to wean off of your opioid medication as soon as possible. If you do not need pain medication after your surgery it is ok  to stop day one. Opioids include: Codeine, Hydrocodone(Norco, Vicodin), Oxycodone(Percocet, oxycontin) and hydromorphone amongst others.  Long term and even short term use of opiods can cause: Increased pain response Dependence Constipation Depression Respiratory depression And more.  Withdrawal symptoms can include Flu like symptoms Nausea, vomiting And more Techniques to manage these symptoms Hydrate well Eat regular healthy meals Stay active Use relaxation  techniques(deep breathing, meditating, yoga) Do Not substitute Alcohol to help with tapering If you have been on opioids for less than two weeks and do not have pain than it is ok to stop all together.  Plan to wean off of opioids This plan should start within one week post op of your joint replacement. Maintain the same interval or time between taking each dose and first decrease the dose.  Cut the total daily intake of opioids by one tablet each day Next start to increase the time between doses. The last dose that should be eliminated is the evening dose.   IF YOU ARE TRANSFERRED TO A SKILLED REHAB FACILITY If the patient is transferred to a skilled rehab facility following release from the hospital, a list of the current medications will be sent to the facility for the patient to continue.  When discharged from the skilled rehab facility, please have the facility set up the patient's Home Health Physical Therapy prior to being released. Also, the skilled facility will be responsible for providing the patient with their medications at time of release from the facility to include their pain medication, the muscle relaxants, and their blood thinner medication. If the patient is still at the rehab facility at time of the two week follow up appointment, the skilled rehab facility will also need to assist the patient in arranging follow up appointment in our office and any transportation needs.  MAKE SURE YOU:  Understand these  instructions.  Get help right away if you are not doing well or get worse.   DENTAL ANTIBIOTICS:  In most cases prophylactic antibiotics for Dental procdeures after total joint surgery are not necessary.  Exceptions are as follows:  1. History of prior total joint infection  2. Severely immunocompromised (Organ Transplant, cancer chemotherapy, Rheumatoid biologic medications such as Humera)  3. Poorly controlled diabetes (A1C &gt; 8.0, blood glucose over 200)  If you have one of these conditions, contact your surgeon for an antibiotic prescription, prior to your dental procedure.    Pick up stool softner and laxative for home use following surgery while on pain medications. Do not submerge incision under water. Please use good hand washing techniques while changing dressing each day. May shower starting three days after surgery. Please use a clean towel to pat the incision dry following showers. Continue to use ice for pain and swelling after surgery. Do not use any lotions or creams on the incision until instructed by your surgeon.

## 2023-08-20 ENCOUNTER — Encounter (HOSPITAL_COMMUNITY): Payer: Self-pay | Admitting: Orthopedic Surgery

## 2023-08-20 LAB — CBC
HCT: 33.2 % — ABNORMAL LOW (ref 36.0–46.0)
Hemoglobin: 10.8 g/dL — ABNORMAL LOW (ref 12.0–15.0)
MCH: 30.4 pg (ref 26.0–34.0)
MCHC: 32.5 g/dL (ref 30.0–36.0)
MCV: 93.5 fL (ref 80.0–100.0)
Platelets: 277 10*3/uL (ref 150–400)
RBC: 3.55 MIL/uL — ABNORMAL LOW (ref 3.87–5.11)
RDW: 13 % (ref 11.5–15.5)
WBC: 12.5 10*3/uL — ABNORMAL HIGH (ref 4.0–10.5)
nRBC: 0 % (ref 0.0–0.2)

## 2023-08-20 LAB — BASIC METABOLIC PANEL
Anion gap: 8 (ref 5–15)
BUN: 17 mg/dL (ref 6–20)
CO2: 25 mmol/L (ref 22–32)
Calcium: 8.9 mg/dL (ref 8.9–10.3)
Chloride: 107 mmol/L (ref 98–111)
Creatinine, Ser: 0.73 mg/dL (ref 0.44–1.00)
GFR, Estimated: >60 mL/min (ref >60)
Glucose, Bld: 167 mg/dL — ABNORMAL HIGH (ref 70–99)
Potassium: 4 mmol/L (ref 3.5–5.1)
Sodium: 140 mmol/L (ref 135–145)

## 2023-08-20 MED ORDER — AMPHETAMINE-DEXTROAMPHETAMINE 10 MG PO TABS
40.0000 mg | ORAL_TABLET | Freq: Every day | ORAL | Status: DC
  Administered 2023-08-20 – 2023-08-22 (×3): 40 mg via ORAL
  Filled 2023-08-20: qty 2
  Filled 2023-08-20 (×3): qty 4

## 2023-08-20 MED ORDER — AMPHETAMINE-DEXTROAMPHETAMINE 10 MG PO TABS
20.0000 mg | ORAL_TABLET | Freq: Every day | ORAL | Status: DC
  Administered 2023-08-20 – 2023-08-21 (×2): 20 mg via ORAL
  Filled 2023-08-20 (×3): qty 2

## 2023-08-20 NOTE — TOC Transition Note (Signed)
Transition of Care [facility]) - CM/SW Discharge Note   Patient Details  Name: Brooke Ward MRN: [id number] Date of Birth: [birth year]  [facility]) CM/SW Contact:  Amada Jupiter, LCSW Phone Number: 08/20/2023, 11:02 AM   Clinical Narrative:     Met with pt who confirms need for RW and 3n1 commode and no DME agency preference - order placed with [employer] for delivery to room prior to dc.  OPPT already set up with [facility].  No further TOC needs.  Final next level of care: OP Rehab Barriers to Discharge: No Barriers Identified   Patient Goals and CMS Choice      Discharge Placement                         Discharge Plan and Services Additional resources added to the After Visit Summary for                  DME Arranged: 3-N-1, Walker rolling DME Agency: [employer] Date DME Agency Contacted: 08/20/23 Time DME Agency Contacted: 1102 Representative spoke with at DME Agency: Ian Malkin            Social Determinants of Health (SDOH) Interventions SDOH Screenings   Food Insecurity: No Food Insecurity (08/19/2023)  Housing: Low Risk  (08/19/2023)  Transportation Needs: No Transportation Needs (08/19/2023)  Utilities: Not At Risk (08/19/2023)  Tobacco Use: Low Risk  (08/19/2023)     Readmission Risk Interventions    08/20/2023   11:01 AM  Readmission Risk Prevention Plan  Post Dischage Appt Complete  Medication Screening Complete  Transportation Screening Complete

## 2023-08-20 NOTE — Progress Notes (Signed)
Physical Therapy Treatment Patient Details Name: Brooke Ward MRN: [id number] DOB: [birth year] Today's Date: 08/20/2023   History of Present Illness 57 yo female presents to therapy s/p L TKA revision on 08/19/2023 due to failure of conservative measures. Pt PMH includes but is not limited to: anxiety, arthritis, asthma, depression, GERD, HTN, hypothyroidism, L wrist surgery, R TKA and L reverse THA (2023).    PT Comments  Pt ate lunch and premedicated for session.  On arrival to room, pt declined mobilizing stating she has been up/down to bathroom multiple times today and that was walking enough for her.  Pt was agreeable to exercises.  Pt assisted with a few exercises however then reported she was finished due to pain.  Session ended per pt request.  Pt still anticipates d/c home tomorrow.    If plan is discharge home, recommend the following: A little help with bathing/dressing/bathroom;A little help with walking and/or transfers;Assistance with cooking/housework;Help with stairs or ramp for entrance   Can travel by private vehicle        Equipment Recommendations  Rolling walker (2 wheels);BSC/3in1    Recommendations for Other Services       Precautions / Restrictions Precautions Precautions: Knee;Fall Restrictions LLE Weight Bearing: Weight bearing as tolerated     Mobility  Bed Mobility    Transfers     Ambulation/Gait    Stairs             Wheelchair Mobility     Tilt Bed    Modified Rankin (Stroke Patients Only)       Balance                                            Cognition Arousal: Alert Behavior During Therapy: WFL for tasks assessed/performed Overall Cognitive Status: Within Functional Limits for tasks assessed                                          Exercises Total Joint Exercises Ankle Circles/Pumps: AROM, Both, 10 reps Quad Sets: AROM, Left, 10 reps Heel Slides: AAROM, Left, 10 reps Hip  ABduction/ADduction: AAROM, Left, 10 reps    General Comments        Pertinent Vitals/Pain Pain Assessment Pain Assessment: 0-10 Pain Score: 8  Pain Location: L knee Pain Descriptors / Indicators: Aching, Discomfort, Throbbing, Grimacing, Sore, Operative site guarding Pain Intervention(s): Monitored during session, Repositioned, Premedicated before session    Home Living                          Prior Function            PT Goals (current goals can now be found in the care plan section) Progress towards PT goals: Progressing toward goals    Frequency    7X/week      PT Plan      Co-evaluation              AM-PAC PT "6 Clicks" Mobility   Outcome Measure  Help needed turning from your back to your side while in a flat bed without using bedrails?: A Little Help needed moving from lying on your back to sitting on the side of a flat bed without using bedrails?: A Little Help  needed moving to and from a bed to a chair (including a wheelchair)?: A Little Help needed standing up from a chair using your arms (e.g., wheelchair or bedside chair)?: A Little Help needed to walk in hospital room?: A Little Help needed climbing 3-5 steps with a railing? : A Lot 6 Click Score: 17    End of Session Equipment Utilized During Treatment: Gait belt Activity Tolerance: Patient limited by pain Patient left: in bed;with call bell/phone within reach;with family/visitor present Nurse Communication: Mobility status PT Visit Diagnosis: Pain Pain - Right/Left: Left Pain - part of body: Knee     Time: 4401-0272 PT Time Calculation (min) (ACUTE ONLY): 14 min  Charges:     $Therapeutic Exercise: 8-22 mins PT General Charges $$ ACUTE PT VISIT: 1 Visit                    Paulino Door, DPT Physical Therapist Acute Rehabilitation Services Office: [phone]    Brooke Ward Payson 08/20/2023, 3:22 PM

## 2023-08-20 NOTE — Progress Notes (Signed)
Physical Therapy Treatment Patient Details Name: Brooke Ward MRN: [id number] DOB: January 02, 1966 Today's Date: 08/20/2023   History of Present Illness 57 yo female presents to therapy s/p L TKA revision on 08/19/2023 due to failure of conservative measures. Pt PMH includes but is not limited to: anxiety, arthritis, asthma, depression, GERD, HTN, hypothyroidism, L wrist surgery, R TKA and L reverse THA (2023).    PT Comments  Pt in recliner on arrival and able to don socks/shoes sitting in recliner without assist.  Pt ambulated however only tolerated short distance due to fatigue and pain.  Pt anticipates d/c home tomorrow.    If plan is discharge home, recommend the following: A little help with bathing/dressing/bathroom;A little help with walking and/or transfers;Assistance with cooking/housework;Help with stairs or ramp for entrance   Can travel by private vehicle        Equipment Recommendations  Rolling walker (2 wheels);BSC/3in1  (Pt is confined to one level of home with no toilet available)   Recommendations for Other Services       Precautions / Restrictions Precautions Precautions: Knee;Fall Restrictions LLE Weight Bearing: Weight bearing as tolerated     Mobility  Bed Mobility Overal bed mobility: Needs Assistance Bed Mobility: Sit to Supine       Sit to supine: Contact guard assist   General bed mobility comments: increased time and effort    Transfers Overall transfer level: Needs assistance Equipment used: Rolling walker (2 wheels) Transfers: Sit to/from Stand Sit to Stand: Contact guard assist           General transfer comment: pt donned socks and slippers in sitting without assist; reliant on UEs to assist with rise and control descent    Ambulation/Gait Ambulation/Gait assistance: Contact guard assist Gait Distance (Feet): 18 Feet Assistive device: Rolling walker (2 wheels) Gait Pattern/deviations: Step-to pattern, Antalgic, Trunk flexed, Knee  flexed in stance - left Gait velocity: decreased     General Gait Details: heavy reliance on B UE support at RW, tends to keep left knee flexed in throughout, distance to pt tolerance; reports pain limiting   Stairs             Wheelchair Mobility     Tilt Bed    Modified Rankin (Stroke Patients Only)       Balance                                            Cognition Arousal: Alert Behavior During Therapy: WFL for tasks assessed/performed Overall Cognitive Status: Within Functional Limits for tasks assessed                                          Exercises      General Comments        Pertinent Vitals/Pain Pain Assessment Pain Assessment: 0-10 Pain Score: 8  Pain Location: L knee Pain Descriptors / Indicators: Aching, Discomfort, Throbbing, Grimacing, Sore, Operative site guarding Pain Intervention(s): Repositioned, RN gave pain meds during session, Monitored during session (pt declined ice)    Home Living                          Prior Function            PT  Goals (current goals can now be found in the care plan section) Progress towards PT goals: Progressing toward goals    Frequency    7X/week      PT Plan      Co-evaluation              AM-PAC PT "6 Clicks" Mobility   Outcome Measure  Help needed turning from your back to your side while in a flat bed without using bedrails?: A Little Help needed moving from lying on your back to sitting on the side of a flat bed without using bedrails?: A Little Help needed moving to and from a bed to a chair (including a wheelchair)?: A Little Help needed standing up from a chair using your arms (e.g., wheelchair or bedside chair)?: A Little Help needed to walk in hospital room?: A Little Help needed climbing 3-5 steps with a railing? : A Lot 6 Click Score: 17    End of Session Equipment Utilized During Treatment: Gait belt Activity  Tolerance: Patient limited by pain;Patient limited by fatigue Patient left: in bed;with call bell/phone within reach;with family/visitor present;with nursing/sitter in room Nurse Communication: Mobility status PT Visit Diagnosis: Other abnormalities of gait and mobility (R26.89);Pain Pain - Right/Left: Left Pain - part of body: Knee     Time: 1030-1045 PT Time Calculation (min) (ACUTE ONLY): 15 min  Charges:    $Gait Training: 8-22 mins PT General Charges $$ ACUTE PT VISIT: 1 Visit                    Paulino Door, DPT Physical Therapist Acute Rehabilitation Services Office: [phone]    Janan Halter Payson 08/20/2023, 12:59 PM

## 2023-08-20 NOTE — Plan of Care (Signed)
  Problem: Nutrition: Goal: Adequate nutrition will be maintained Outcome: Progressing   Problem: Coping: Goal: Level of anxiety will decrease Outcome: Progressing   Problem: Pain Management: Goal: General experience of comfort will improve Outcome: Progressing   Problem: Safety: Goal: Ability to remain free from injury will improve Outcome: Progressing

## 2023-08-20 NOTE — Plan of Care (Signed)
  Problem: Education: Goal: Knowledge of General Education information will improve Description: Including pain rating scale, medication(s)/side effects and non-pharmacologic comfort measures Outcome: Progressing   Problem: Activity: Goal: Risk for activity intolerance will decrease Outcome: Progressing   Problem: Pain Management: Goal: General experience of comfort will improve Outcome: Progressing

## 2023-08-20 NOTE — Progress Notes (Signed)
   Subjective: 1 Day Post-Op Procedure(s) (LRB): TOTAL KNEE REVISION (Left) Patient seen in rounds by Dr. Lequita Halt. Patient is well, and has had no acute complaints or problems other than pain. Denies SOB or chest pain. Denies calf pain. Foley cath removed this AM. Patient reports pain as severe. Worked with physical therapy yesterday and ambulated 18'. We will continue physical therapy today.  Objective: Vital signs in last 24 hours: Temp:  [97.5 F (36.4 C)-99.4 F (37.4 C)] 99.4 F (37.4 C) (12/05 0640) Pulse Rate:  [56-91] 86 (12/05 0640) Resp:  [14-24] 16 (12/05 0640) BP: (104-185)/(59-106) 142/78 (12/05 0640) SpO2:  [93 %-100 %] 96 % (12/05 0640) Weight:  [111.1 kg] 111.1 kg (12/04 1500)  Intake/Output from previous day:  Intake/Output Summary (Last 24 hours) at 08/20/2023 0756 Last data filed at 08/20/2023 4696 Gross per 24 hour  Intake 2900 ml  Output 2520 ml  Net 380 ml     Intake/Output this shift: No intake/output data recorded.  Labs: Recent Labs    08/20/23 0338  HGB 10.8*   Recent Labs    08/20/23 0338  WBC 12.5*  RBC 3.55*  HCT 33.2*  PLT 277   Recent Labs    08/20/23 0338  NA 140  K 4.0  CL 107  CO2 25  BUN 17  CREATININE 0.73  GLUCOSE 167*  CALCIUM 8.9   No results for input(s): "LABPT", "INR" in the last 72 hours.  Exam: General - Patient is Alert and Oriented Extremity - Neurologically intact Neurovascular intact Sensation intact distally Dorsiflexion/Plantar flexion intact Dressing - dressing C/D/I Motor Function - intact, moving foot and toes well on exam.  Past Medical History:  Diagnosis Date   Anxiety    Arthritis    Asthma    Depression    GERD (gastroesophageal reflux disease)    Hypertension    Hypothyroidism     Assessment/Plan: 1 Day Post-Op Procedure(s) (LRB): TOTAL KNEE REVISION (Left) Principal Problem:   Failed total knee arthroplasty (HCC)  Estimated body mass index is 43.4 kg/m as calculated from  the following:   Height as of this encounter: 5\' 3"  (1.6 m).   Weight as of this encounter: 111.1 kg. Advance diet Up with therapy D/C IV fluids  Anticipated LOS equal to or greater than 2 midnights due to - Age 55 and older with one or more of the following:  - Obesity  - Expected need for hospital services (PT, OT, Nursing) required for safe  discharge  - Anticipated need for postoperative skilled nursing care or inpatient rehab  - Active co-morbidities: Chronic pain requiring opiods OR   - Unanticipated findings during/Post Surgery: Slow post-op progression: GI, pain control, mobility  - Patient is a high risk of re-admission due to: None   DVT Prophylaxis - Aspirin Weight bearing as tolerated.  Continue physical therapy. Will require additional night in hospital to maximize mobility and for pain control. Scheduled for OPPT at [facility] once discharged.  Alfonzo Feller, PA-C Orthopedic Surgery 08/20/2023, 7:56 AM

## 2023-08-21 ENCOUNTER — Other Ambulatory Visit (HOSPITAL_COMMUNITY): Payer: Self-pay

## 2023-08-21 LAB — CBC
HCT: 33.8 % — ABNORMAL LOW (ref 36.0–46.0)
Hemoglobin: 10.8 g/dL — ABNORMAL LOW (ref 12.0–15.0)
MCH: 29.9 pg (ref 26.0–34.0)
MCHC: 32 g/dL (ref 30.0–36.0)
MCV: 93.6 fL (ref 80.0–100.0)
Platelets: 255 10*3/uL (ref 150–400)
RBC: 3.61 MIL/uL — ABNORMAL LOW (ref 3.87–5.11)
RDW: 13.2 % (ref 11.5–15.5)
WBC: 12.6 10*3/uL — ABNORMAL HIGH (ref 4.0–10.5)
nRBC: 0 % (ref 0.0–0.2)

## 2023-08-21 MED ORDER — ONDANSETRON HCL 4 MG PO TABS: 4.0000 mg | ORAL_TABLET | Freq: Four times a day (QID) | ORAL | 0 refills | Status: DC | PRN

## 2023-08-21 MED ORDER — ASPIRIN 81 MG PO CHEW
81.0000 mg | CHEWABLE_TABLET | Freq: Two times a day (BID) | ORAL | 0 refills | Status: AC
  Filled 2023-08-21: qty 38, 19d supply, fill #0

## 2023-08-21 MED ORDER — CYCLOBENZAPRINE HCL 10 MG PO TABS: 10.0000 mg | ORAL_TABLET | Freq: Three times a day (TID) | ORAL | 0 refills | Status: DC | PRN

## 2023-08-21 MED ORDER — ONDANSETRON HCL 4 MG PO TABS
4.0000 mg | ORAL_TABLET | Freq: Four times a day (QID) | ORAL | 0 refills | Status: AC | PRN
  Filled 2023-08-21 (×2): qty 20, 5d supply, fill #0

## 2023-08-21 MED ORDER — HYDROMORPHONE HCL 2 MG PO TABS
2.0000 mg | ORAL_TABLET | Freq: Four times a day (QID) | ORAL | 0 refills | Status: DC | PRN
  Filled 2023-08-21: qty 42, 7d supply, fill #0
  Filled 2023-08-21: qty 42, 6d supply, fill #0

## 2023-08-21 MED ORDER — ASPIRIN 81 MG PO CHEW: 81.0000 mg | CHEWABLE_TABLET | Freq: Two times a day (BID) | ORAL | 0 refills | Status: DC

## 2023-08-21 MED ORDER — GABAPENTIN 300 MG PO CAPS
300.0000 mg | ORAL_CAPSULE | ORAL | 0 refills | Status: AC
  Filled 2023-08-21: qty 84, 42d supply, fill #0
  Filled 2023-08-21: qty 84, 30d supply, fill #0

## 2023-08-21 MED ORDER — HYDROMORPHONE HCL 2 MG PO TABS: 2.0000 mg | ORAL_TABLET | Freq: Four times a day (QID) | ORAL | 0 refills | Status: DC | PRN

## 2023-08-21 MED ORDER — GABAPENTIN 300 MG PO CAPS: 300.0000 mg | ORAL_CAPSULE | ORAL | 0 refills | Status: DC

## 2023-08-21 MED ORDER — CYCLOBENZAPRINE HCL 10 MG PO TABS
10.0000 mg | ORAL_TABLET | Freq: Three times a day (TID) | ORAL | 0 refills | Status: AC | PRN
  Filled 2023-08-21 (×2): qty 40, 14d supply, fill #0

## 2023-08-21 MED ORDER — KETOROLAC TROMETHAMINE 15 MG/ML IJ SOLN
7.5000 mg | Freq: Four times a day (QID) | INTRAMUSCULAR | Status: DC
  Administered 2023-08-21 (×2): 7.5 mg via INTRAVENOUS
  Filled 2023-08-21 (×4): qty 1

## 2023-08-21 NOTE — Plan of Care (Signed)
  Problem: Coping: Goal: Level of anxiety will decrease Outcome: Progressing   Problem: Pain Management: Goal: General experience of comfort will improve Outcome: Progressing   Problem: Safety: Goal: Ability to remain free from injury will improve Outcome: Progressing   Problem: Skin Integrity: Goal: Risk for impaired skin integrity will decrease Outcome: Progressing

## 2023-08-21 NOTE — Progress Notes (Signed)
Physical Therapy Treatment Patient Details Name: Brooke Ward MRN: [id number] DOB: Dec 02, 1965 Today's Date: 08/21/2023   History of Present Illness 57 yo female presents to therapy s/p L TKA revision on 08/19/2023 due to failure of conservative measures. Pt PMH includes but is not limited to: anxiety, arthritis, asthma, depression, GERD, HTN, hypothyroidism, L wrist surgery, R TKA and L reverse THA (2023).    PT Comments  Pt sleeping on arrival and took 2 minutes to arouse.  Pt very lethargic initially and required a little time to become awake/alert to stand and ambulate.  Pt very groggy throughout session and required cues for safety.  Pt reports she hopes to d/c home tomorrow.     If plan is discharge home, recommend the following: A little help with bathing/dressing/bathroom;A little help with walking and/or transfers;Assistance with cooking/housework;Help with stairs or ramp for entrance   Can travel by private vehicle        Equipment Recommendations  Rolling walker (2 wheels);BSC/3in1    Recommendations for Other Services       Precautions / Restrictions Precautions Precautions: Knee;Fall Restrictions LLE Weight Bearing: Weight bearing as tolerated     Mobility  Bed Mobility Overal bed mobility: Needs Assistance Bed Mobility: Supine to Sit     Supine to sit: Min assist Sit to supine: Min assist   General bed mobility comments: increased time and effort due to pain, assist for Lt LE; pt sitting EOB end of session eating    Transfers Overall transfer level: Needs assistance Equipment used: Rolling walker (2 wheels) Transfers: Sit to/from Stand Sit to Stand: Min assist           General transfer comment: verbal cues for UE and LE positioning; required 3-4 attempts prior to being able to fully rise; lethargy likely limiting however pt more awake/alert upon standing and wanted to ambulate    Ambulation/Gait Ambulation/Gait assistance: Min assist Gait  Distance (Feet): 30 Feet Assistive device: Rolling walker (2 wheels) Gait Pattern/deviations: Step-to pattern, Antalgic, Trunk flexed, Knee flexed in stance - left       General Gait Details: heavy reliance on B UE support at RW, tends to keep left knee flexed in throughout, distance to pt tolerance; LEs occasionally buckling with light assist required   Stairs             Wheelchair Mobility     Tilt Bed    Modified Rankin (Stroke Patients Only)       Balance                                            Cognition Arousal: Lethargic, Suspect due to medications Behavior During Therapy: WFL for tasks assessed/performed Overall Cognitive Status: Within Functional Limits for tasks assessed                                          Exercises      General Comments        Pertinent Vitals/Pain Pain Assessment Pain Assessment: Faces Pain Score: 10-Worst pain ever Faces Pain Scale: Hurts a little bit Pain Location: L knee Pain Descriptors / Indicators: Aching, Sore Pain Intervention(s): Repositioned, Monitored during session, Premedicated before session    Home Living  Prior Function            PT Goals (current goals can now be found in the care plan section) Progress towards PT goals: Progressing toward goals    Frequency    7X/week      PT Plan      Co-evaluation              AM-PAC PT "6 Clicks" Mobility   Outcome Measure  Help needed turning from your back to your side while in a flat bed without using bedrails?: A Little Help needed moving from lying on your back to sitting on the side of a flat bed without using bedrails?: A Little Help needed moving to and from a bed to a chair (including a wheelchair)?: A Little Help needed standing up from a chair using your arms (e.g., wheelchair or bedside chair)?: A Little Help needed to walk in hospital room?: A Little Help  needed climbing 3-5 steps with a railing? : A Lot 6 Click Score: 17    End of Session Equipment Utilized During Treatment: Gait belt Activity Tolerance: Patient limited by lethargy Patient left: in bed;with call bell/phone within reach;with family/visitor present;with bed alarm set Nurse Communication: Mobility status PT Visit Diagnosis: Difficulty in walking, not elsewhere classified (R26.2) Pain - Right/Left: Left Pain - part of body: Knee     Time: 3875-6433 PT Time Calculation (min) (ACUTE ONLY): 23 min  Charges:    $Gait Training: 23-37 mins  PT General Charges $$ ACUTE PT VISIT: 1 Visit                     Paulino Door, DPT Physical Therapist Acute Rehabilitation Services Office: [phone]    Janan Halter [place] 08/21/2023, 4:38 PM

## 2023-08-21 NOTE — Progress Notes (Signed)
Physical Therapy Treatment Patient Details Name: Brooke Ward MRN: [id number] DOB: 1966-06-07 Today's Date: 08/21/2023   History of Present Illness 57 yo female presents to therapy s/p L TKA revision on 08/19/2023 due to failure of conservative measures. Pt PMH includes but is not limited to: anxiety, arthritis, asthma, depression, GERD, HTN, hypothyroidism, L wrist surgery, R TKA and L reverse THA (2023).    PT Comments  Pt and family member in room report pt had a difficult night and morning due to increased pain.  Pt attempted to mobilize however unable to stand fully upright despite multiple attempts due to pain.     If plan is discharge home, recommend the following: A little help with bathing/dressing/bathroom;A little help with walking and/or transfers;Assistance with cooking/housework;Help with stairs or ramp for entrance   Can travel by private vehicle        Equipment Recommendations  Rolling walker (2 wheels);BSC/3in1    Recommendations for Other Services       Precautions / Restrictions Precautions Precautions: Knee;Fall Restrictions LLE Weight Bearing: Weight bearing as tolerated     Mobility  Bed Mobility Overal bed mobility: Needs Assistance Bed Mobility: Supine to Sit     Supine to sit: Min assist Sit to supine: Min assist   General bed mobility comments: increased time and effort due to pain, assist for Lt LE    Transfers Overall transfer level: Needs assistance Equipment used: Rolling walker (2 wheels) Transfers: Sit to/from Stand Sit to Stand: Max assist, Mod assist, +2 physical assistance           General transfer comment: verbal cues for UE and LE positioning; attempted a few times and pt unable to tolerate standing fully upright due to pain    Ambulation/Gait                   Stairs             Wheelchair Mobility     Tilt Bed    Modified Rankin (Stroke Patients Only)       Balance                                             Cognition Arousal: Alert Behavior During Therapy: WFL for tasks assessed/performed Overall Cognitive Status: Within Functional Limits for tasks assessed                                          Exercises      General Comments        Pertinent Vitals/Pain Pain Assessment Pain Assessment: 0-10 Pain Score: 10-Worst pain ever Pain Location: L knee Pain Descriptors / Indicators: Aching, Discomfort, Throbbing, Grimacing, Sore, Operative site guarding Pain Intervention(s): Repositioned, Monitored during session, Premedicated before session    Home Living                          Prior Function            PT Goals (current goals can now be found in the care plan section) Progress towards PT goals: Progressing toward goals    Frequency    7X/week      PT Plan      Co-evaluation  AM-PAC PT "6 Clicks" Mobility   Outcome Measure  Help needed turning from your back to your side while in a flat bed without using bedrails?: A Little Help needed moving from lying on your back to sitting on the side of a flat bed without using bedrails?: A Little Help needed moving to and from a bed to a chair (including a wheelchair)?: A Lot Help needed standing up from a chair using your arms (e.g., wheelchair or bedside chair)?: A Lot Help needed to walk in hospital room?: Total Help needed climbing 3-5 steps with a railing? : Total 6 Click Score: 12    End of Session Equipment Utilized During Treatment: Gait belt Activity Tolerance: Patient limited by pain Patient left: in bed;with call bell/phone within reach;with family/visitor present;with bed alarm set Nurse Communication: Mobility status PT Visit Diagnosis: Pain;Difficulty in walking, not elsewhere classified (R26.2) Pain - Right/Left: Left Pain - part of body: Knee     Time: 1206-1226 PT Time Calculation (min) (ACUTE ONLY): 20 min  Charges:     $Therapeutic Activity: 8-22 mins PT General Charges $$ ACUTE PT VISIT: 1 Visit                    Paulino Door, DPT Physical Therapist Acute Rehabilitation Services Office: [phone]    Brooke Ward 08/21/2023, 4:30 PM

## 2023-08-21 NOTE — Plan of Care (Signed)
  Problem: Education: Goal: Knowledge of General Education information will improve Description Including pain rating scale, medication(s)/side effects and non-pharmacologic comfort measures Outcome: Progressing   

## 2023-08-21 NOTE — Progress Notes (Signed)
   Subjective: 2 Days Post-Op Procedure(s) (LRB): TOTAL KNEE REVISION (Left) Patient reports pain as severe.   Patient seen in rounds for Dr. Lequita Halt. Patient reports severe pain last night beginning around 8pm, and that she is afraid to work with physical therapy today. Patient was sleeping upon entering room this morning. We discussed that postop night 2 is typically the most painful night when all anesthetic blocks have worn off. Hopefully she will see some improvement throughout the day today.  Objective: Vital signs in last 24 hours: Temp:  [97.8 F (36.6 C)-98.7 F (37.1 C)] 98.7 F (37.1 C) (12/06 0452) Pulse Rate:  [69-109] 109 (12/06 0452) Resp:  [13-18] 16 (12/06 0452) BP: (114-158)/(67-104) 131/67 (12/06 0452) SpO2:  [95 %-98 %] 95 % (12/06 0452)  Intake/Output from previous day:  Intake/Output Summary (Last 24 hours) at 08/21/2023 0747 Last data filed at 08/21/2023 0500 Gross per 24 hour  Intake 1320 ml  Output --  Net 1320 ml    Intake/Output this shift: No intake/output data recorded.  Labs: Recent Labs    08/20/23 0338 08/21/23 0334  HGB 10.8* 10.8*   Recent Labs    08/20/23 0338 08/21/23 0334  WBC 12.5* 12.6*  RBC 3.55* 3.61*  HCT 33.2* 33.8*  PLT 277 255   Recent Labs    08/20/23 0338  NA 140  K 4.0  CL 107  CO2 25  BUN 17  CREATININE 0.73  GLUCOSE 167*  CALCIUM 8.9   No results for input(s): "LABPT", "INR" in the last 72 hours.  Exam: General - Patient is Alert and Oriented Extremity - Neurologically intact Neurovascular intact Sensation intact distally Dorsiflexion/Plantar flexion intact Dressing/Incision - clean, dry, no drainage Motor Function - intact, moving foot and toes well on exam.   Past Medical History:  Diagnosis Date   Anxiety    Arthritis    Asthma    Depression    GERD (gastroesophageal reflux disease)    Hypertension    Hypothyroidism     Assessment/Plan: 2 Days Post-Op Procedure(s) (LRB): TOTAL KNEE  REVISION (Left) Principal Problem:   Failed total knee arthroplasty (HCC)  Estimated body mass index is 43.4 kg/m as calculated from the following:   Height as of this encounter: 5\' 3"  (1.6 m).   Weight as of this encounter: 111.1 kg. Up with therapy  DVT Prophylaxis - Aspirin Weight-bearing as tolerated  Discussed with patient and family member the importance of mobilizing with therapy today and begin ROM work to reduce risk of stiffness. States pain has not been controlled, but again was asleep this AM when I entered room. We were aware prior to surgery that pain control would likely be an issue given chronic oxycodone, is at the maximum PO breakthrough that we can comfortably give with this.  Ordered her IV toradol, no hx of renal impairment. Hopefully this will help her feel well enough to mobilize.  Discharge to home once cleared with physical therapy, she is scheduled for OPPT at [facility].  Arther Abbott, PA-C Orthopedic Surgery [phone] 08/21/2023, 7:47 AM

## 2023-08-22 ENCOUNTER — Other Ambulatory Visit (HOSPITAL_COMMUNITY): Payer: Self-pay

## 2023-08-22 NOTE — Progress Notes (Signed)
Orthopedics Progress Note  Subjective: Patient feeling better today. She still has pain with AROM of the knee and mobilization  Objective:  Vitals:   08/22/23 0444 08/22/23 0840  BP: 116/61 (!) 153/88  Pulse: (!) 109 (!) 112  Resp: 18   Temp: 98.9 F (37.2 C)   SpO2: 95%     General: Awake and alert  Musculoskeletal: Spotting noted on the Aquacel dressing but no active bleeding or drainage.  Moderate swelling around the knee. Good quad set and no pain with ankle pump Neurovascularly intact  Lab Results  Component Value Date   WBC 12.6 (H) 08/21/2023   HGB 10.8 (L) 08/21/2023   HCT 33.8 (L) 08/21/2023   MCV 93.6 08/21/2023   PLT 255 08/21/2023       Component Value Date/Time   NA 140 08/20/2023 0338   K 4.0 08/20/2023 0338   CL 107 08/20/2023 0338   CO2 25 08/20/2023 0338   GLUCOSE 167 (H) 08/20/2023 0338   BUN 17 08/20/2023 0338   CREATININE 0.73 08/20/2023 0338   CALCIUM 8.9 08/20/2023 0338   GFRNONAA >60 08/20/2023 0338   GFRAA  01/28/2010 0515    >60        The eGFR has been calculated using the MDRD equation. This calculation has not been validated in all clinical situations. eGFR's persistently <60 mL/min signify possible Chronic Kidney Disease.    Lab Results  Component Value Date   INR 2.00 (H) 01/28/2010   INR 2.99 (H) 01/27/2010   INR (HH) 01/27/2010    8.96 SPECIMEN CHECKED FOR CLOTS RESULT REPEATED AND VERIFIED CRITICAL RESULT CALLED TO, READ BACK BY AND VERIFIED WITH: Albesa Seen RN AT 0505 01/27/2010 BY R LINEBERRY    Assessment/Plan:  s/p Procedure(s): TOTAL KNEE REVISION Patient continues to work toward discharge with PT.  Hoping for discharge today. Order placed pending PT clearance. Outpatient PT scheduled per PA Edmisten.   Almedia Balls. Ranell Patrick, MD 08/22/2023 10:26 AM

## 2023-08-22 NOTE — Plan of Care (Signed)
Patient verbalized discharge instructions Problem: Education: Goal: Knowledge of General Education information will improve Description: Including pain rating scale, medication(s)/side effects and non-pharmacologic comfort measures Outcome: Adequate for Discharge   Problem: Health Behavior/Discharge Planning: Goal: Ability to manage health-related needs will improve Outcome: Adequate for Discharge   Problem: Clinical Measurements: Goal: Ability to maintain clinical measurements within normal limits will improve Outcome: Adequate for Discharge Goal: Will remain free from infection Outcome: Adequate for Discharge Goal: Diagnostic test results will improve Outcome: Adequate for Discharge Goal: Respiratory complications will improve Outcome: Adequate for Discharge Goal: Cardiovascular complication will be avoided Outcome: Adequate for Discharge   Problem: Activity: Goal: Risk for activity intolerance will decrease Outcome: Adequate for Discharge   Problem: Nutrition: Goal: Adequate nutrition will be maintained Outcome: Adequate for Discharge   Problem: Coping: Goal: Level of anxiety will decrease Outcome: Adequate for Discharge   Problem: Elimination: Goal: Will not experience complications related to bowel motility Outcome: Adequate for Discharge Goal: Will not experience complications related to urinary retention Outcome: Adequate for Discharge   Problem: Pain Management: Goal: General experience of comfort will improve Outcome: Adequate for Discharge   Problem: Safety: Goal: Ability to remain free from injury will improve Outcome: Adequate for Discharge   Problem: Skin Integrity: Goal: Risk for impaired skin integrity will decrease Outcome: Adequate for Discharge   Problem: Education: Goal: Knowledge of the prescribed therapeutic regimen will improve Outcome: Adequate for Discharge Goal: Individualized Educational Video(s) Outcome: Adequate for Discharge    Problem: Activity: Goal: Ability to avoid complications of mobility impairment will improve Outcome: Adequate for Discharge Goal: Range of joint motion will improve Outcome: Adequate for Discharge   Problem: Clinical Measurements: Goal: Postoperative complications will be avoided or minimized Outcome: Adequate for Discharge   Problem: Pain Management: Goal: Pain level will decrease with appropriate interventions Outcome: Adequate for Discharge   Problem: Skin Integrity: Goal: Will show signs of wound healing Outcome: Adequate for Discharge   Problem: Education: Goal: Knowledge of General Education information will improve Description: Including pain rating scale, medication(s)/side effects and non-pharmacologic comfort measures Outcome: Adequate for Discharge   Problem: Health Behavior/Discharge Planning: Goal: Ability to manage health-related needs will improve Outcome: Adequate for Discharge   Problem: Clinical Measurements: Goal: Ability to maintain clinical measurements within normal limits will improve Outcome: Adequate for Discharge Goal: Will remain free from infection Outcome: Adequate for Discharge Goal: Diagnostic test results will improve Outcome: Adequate for Discharge Goal: Respiratory complications will improve Outcome: Adequate for Discharge Goal: Cardiovascular complication will be avoided Outcome: Adequate for Discharge   Problem: Activity: Goal: Risk for activity intolerance will decrease Outcome: Adequate for Discharge   Problem: Nutrition: Goal: Adequate nutrition will be maintained Outcome: Adequate for Discharge   Problem: Coping: Goal: Level of anxiety will decrease Outcome: Adequate for Discharge   Problem: Elimination: Goal: Will not experience complications related to bowel motility Outcome: Adequate for Discharge Goal: Will not experience complications related to urinary retention Outcome: Adequate for Discharge   Problem: Pain  Management: Goal: General experience of comfort will improve Outcome: Adequate for Discharge   Problem: Safety: Goal: Ability to remain free from injury will improve Outcome: Adequate for Discharge   Problem: Skin Integrity: Goal: Risk for impaired skin integrity will decrease Outcome: Adequate for Discharge   Problem: Education: Goal: Knowledge of the prescribed therapeutic regimen will improve Outcome: Adequate for Discharge Goal: Individualized Educational Video(s) Outcome: Adequate for Discharge   Problem: Activity: Goal: Ability to avoid complications of mobility impairment will  improve Outcome: Adequate for Discharge Goal: Range of joint motion will improve Outcome: Adequate for Discharge   Problem: Clinical Measurements: Goal: Postoperative complications will be avoided or minimized Outcome: Adequate for Discharge   Problem: Pain Management: Goal: Pain level will decrease with appropriate interventions Outcome: Adequate for Discharge   Problem: Skin Integrity: Goal: Will show signs of wound healing Outcome: Adequate for Discharge   Problem: Education: Goal: Knowledge of the prescribed therapeutic regimen will improve Outcome: Adequate for Discharge Goal: Individualized Educational Video(s) Outcome: Adequate for Discharge   Problem: Activity: Goal: Ability to avoid complications of mobility impairment will improve Outcome: Adequate for Discharge Goal: Range of joint motion will improve Outcome: Adequate for Discharge   Problem: Clinical Measurements: Goal: Postoperative complications will be avoided or minimized Outcome: Adequate for Discharge   Problem: Pain Management: Goal: Pain level will decrease with appropriate interventions Outcome: Adequate for Discharge   Problem: Skin Integrity: Goal: Will show signs of wound healing Outcome: Adequate for Discharge

## 2023-08-22 NOTE — Progress Notes (Signed)
Physical Therapy Treatment Patient Details Name: Brooke Ward MRN: [id number] DOB: 15-Dec-1965 Today's Date: 08/22/2023   History of Present Illness 57 yo female presents to therapy s/p L TKA revision on 08/19/2023 due to failure of conservative measures. Pt PMH includes but is not limited to: anxiety, arthritis, asthma, depression, GERD, HTN, hypothyroidism, L wrist surgery, R TKA and L reverse THA (2023).    PT Comments  Pt in bathroom bathing and getting dressed on arrival to room.  Pt reports she is discharging home today.  Pt ready to move and ambulated in hallway and practiced one step.  Pt feels ready to d/c home and had no further questions.     If plan is discharge home, recommend the following: A little help with bathing/dressing/bathroom;A little help with walking and/or transfers;Assistance with cooking/housework;Help with stairs or ramp for entrance   Can travel by private vehicle        Equipment Recommendations  Rolling walker (2 wheels);BSC/3in1    Recommendations for Other Services       Precautions / Restrictions Precautions Precautions: Knee;Fall Restrictions LLE Weight Bearing: Weight bearing as tolerated     Mobility  Bed Mobility               General bed mobility comments: pt sitting in bathroom on arrival    Transfers Overall transfer level: Needs assistance Equipment used: Rolling walker (2 wheels) Transfers: Sit to/from Stand Sit to Stand: Contact guard assist, Supervision           General transfer comment: verbal cues for UE and LE positioning    Ambulation/Gait Ambulation/Gait assistance: Contact guard assist Gait Distance (Feet): 60 Feet Assistive device: Rolling walker (2 wheels) Gait Pattern/deviations: Step-to pattern, Antalgic, Trunk flexed, Knee flexed in stance - left Gait velocity: decreased     General Gait Details: heavy reliance on B UE support at RW, cues for sequence and safety, able to progress  distance   Stairs Stairs: Yes Stairs assistance: Contact guard assist Stair Management: Step to pattern, Forwards, With walker Number of Stairs: 1 General stair comments: verbal cues for sequence and safety   Wheelchair Mobility     Tilt Bed    Modified Rankin (Stroke Patients Only)       Balance                                            Cognition Arousal: Alert Behavior During Therapy: WFL for tasks assessed/performed Overall Cognitive Status: Within Functional Limits for tasks assessed                                          Exercises      General Comments        Pertinent Vitals/Pain Pain Assessment Pain Assessment: Faces Faces Pain Scale: Hurts little more Pain Location: L knee Pain Descriptors / Indicators: Aching, Sore Pain Intervention(s): Premedicated before session, Repositioned, Monitored during session    Home Living                          Prior Function            PT Goals (current goals can now be found in the care plan section) Progress towards PT goals: Progressing toward  goals    Frequency    7X/week      PT Plan      Co-evaluation              AM-PAC PT "6 Clicks" Mobility   Outcome Measure  Help needed turning from your back to your side while in a flat bed without using bedrails?: A Little Help needed moving from lying on your back to sitting on the side of a flat bed without using bedrails?: A Little Help needed moving to and from a bed to a chair (including a wheelchair)?: A Little Help needed standing up from a chair using your arms (e.g., wheelchair or bedside chair)?: A Little Help needed to walk in hospital room?: A Little Help needed climbing 3-5 steps with a railing? : A Little 6 Click Score: 18    End of Session Equipment Utilized During Treatment: Gait belt Activity Tolerance: Patient tolerated treatment well Patient left: in bed;with call bell/phone  within reach;with family/visitor present Nurse Communication: Mobility status PT Visit Diagnosis: Difficulty in walking, not elsewhere classified (R26.2)     Time: 2130-8657 PT Time Calculation (min) (ACUTE ONLY): 22 min  Charges:    $Gait Training: 8-22 mins PT General Charges $$ ACUTE PT VISIT: 1 Visit                     Paulino Door, DPT Physical Therapist Acute Rehabilitation Services Office: [phone]    Janan Halter Payson 08/22/2023, 4:45 PM

## 2023-08-22 NOTE — Progress Notes (Signed)
Discharge medications delivered to room in  a secure bag - given to the pt in the room

## 2023-08-22 NOTE — Discharge Summary (Signed)
In most cases prophylactic antibiotics for Dental procdeures after total joint surgery are not necessary.  Exceptions are as follows:  1. History of prior total joint infection  2. Severely immunocompromised (Organ Transplant, cancer chemotherapy, Rheumatoid biologic meds such as Humera)  3. Poorly controlled diabetes (A1C &gt; 8.0, blood glucose over 200)  If you have one of these conditions, contact your surgeon for an antibiotic prescription, prior to your dental procedure. Orthopedic Discharge Summary        Physician Discharge Summary  Patient ID: Brooke Ward MRN: [id number] DOB/AGE: Jun 13, 1966 57 y.o.  Admit date: 08/19/2023 Discharge date: 08/22/2023   Procedures:  Procedure(s) (LRB): TOTAL KNEE REVISION (Left)  Attending Physician:  Dr. Trudee Grip  Admission Diagnoses:   Failed Left total knee replacement  Discharge Diagnoses:  same   Past Medical History:  Diagnosis Date   Anxiety    Arthritis    Asthma    Depression    GERD (gastroesophageal reflux disease)    Hypertension    Hypothyroidism     PCP: Jearld Lesch, MD   Discharged Condition: stable  Hospital Course:  Patient underwent the above stated procedure on 08/19/2023. Patient tolerated the procedure well and brought to the recovery room in good condition and subsequently to the floor. Patient had an uncomplicated hospital course and was stable for discharge.   Disposition: Discharge disposition: 01-Home or Self Care      with follow up in 2 weeks    Follow-up Information     Ollen Gross, MD. Schedule an appointment as soon as possible for a visit in 2 week(s).   Specialty: Orthopedic Surgery Contact information: [address] [place] [address] [phone]                 Dental Antibiotics:  In most cases prophylactic antibiotics for Dental procdeures after total joint surgery are not necessary.  Exceptions are as follows:  1.  History of prior total joint infection  2. Severely immunocompromised (Organ Transplant, cancer chemotherapy, Rheumatoid biologic meds such as Humera)  3. Poorly controlled diabetes (A1C &gt; 8.0, blood glucose over 200)  If you have one of these conditions, contact your surgeon for an antibiotic prescription, prior to your dental procedure.  Discharge Instructions     Call MD / Call 911   Complete by: As directed    If you experience chest pain or shortness of breath, CALL 911 and be transported to the hospital emergency room.  If you develope a fever above 101 F, pus (white drainage) or increased drainage or redness at the wound, or calf pain, call your surgeon's office.   Constipation Prevention   Complete by: As directed    Drink plenty of fluids.  Prune juice may be helpful.  You may use a stool softener, such as Colace (over the counter) 100 mg twice a day.  Use MiraLax (over the counter) for constipation as needed.   Diet - low sodium heart healthy   Complete by: As directed    Increase activity slowly as tolerated   Complete by: As directed    Post-operative opioid taper instructions:   Complete by: As directed    POST-OPERATIVE OPIOID TAPER INSTRUCTIONS: It is important to wean off of your opioid medication as soon as possible. If you do not need pain medication after your surgery it is ok to stop day one. Opioids include: Codeine, Hydrocodone(Norco, Vicodin), Oxycodone(Percocet, oxycontin) and hydromorphone amongst others.  Long term and even  short term use of opiods can cause: Increased pain response Dependence Constipation Depression Respiratory depression And more.  Withdrawal symptoms can include Flu like symptoms Nausea, vomiting And more Techniques to manage these symptoms Hydrate well Eat regular healthy meals Stay active Use relaxation techniques(deep breathing, meditating, yoga) Do Not substitute Alcohol to help with tapering If you have been on opioids  for less than two weeks and do not have pain than it is ok to stop all together.  Plan to wean off of opioids This plan should start within one week post op of your joint replacement. Maintain the same interval or time between taking each dose and first decrease the dose.  Cut the total daily intake of opioids by one tablet each day Next start to increase the time between doses. The last dose that should be eliminated is the evening dose.          Allergies as of 08/22/2023       Reactions   Lisinopril Swelling   Throat swelling        Medication List     STOP taking these medications    aspirin EC 81 MG tablet Replaced by: aspirin 81 MG chewable tablet   naproxen 500 MG tablet Commonly known as: Naprosyn       TAKE these medications    albuterol 108 (90 Base) MCG/ACT inhaler Commonly known as: VENTOLIN HFA Inhale 2 puffs into the lungs every 6 (six) hours as needed for wheezing or shortness of breath.   ALPRAZolam 1 MG tablet Commonly known as: XANAX Take 1 mg by mouth in the morning, at noon, and at bedtime.   amitriptyline 50 MG tablet Commonly known as: ELAVIL Take 50 mg by mouth at bedtime.   amphetamine-dextroamphetamine 20 MG tablet Commonly known as: ADDERALL Take 20-40 mg by mouth See admin instructions. Take 40 mg by mouth in the morning and take 20 mg in the afternoon.   aspirin 81 MG chewable tablet Chew 1 tablet (81 mg total) by mouth 2 (two) times daily for 19 days. Then resume an 81 mg aspirin once a day Replaces: aspirin EC 81 MG tablet   atenolol 25 MG tablet Commonly known as: TENORMIN Take 25 mg by mouth daily.   cyclobenzaprine 10 MG tablet Commonly known as: FLEXERIL Take 1 tablet (10 mg total) by mouth 3 (three) times daily as needed for muscle spasms.   docusate sodium 100 MG capsule Commonly known as: COLACE Take 100 mg by mouth 2 (two) times daily as needed for mild constipation.   escitalopram 10 MG tablet Commonly known  as: LEXAPRO Take 10 mg by mouth daily.   fluticasone 50 MCG/ACT nasal spray Commonly known as: FLONASE Place 1 spray into both nostrils daily as needed for allergies.   gabapentin 300 MG capsule Commonly known as: NEURONTIN Take 1 capsule (300 mg total) by mouth as directed. Take a 300 mg capsule three times a day for two weeks following surgery.Then take a 300 mg capsule two times a day for two weeks. Then take a 300 mg capsule once a day for two weeks. Then discontinue.   HYDROmorphone 2 MG tablet Commonly known as: DILAUDID Take 1-2 tablets (2-4 mg total) by mouth every 6 (six) hours as needed (severe pain not controlled by chronic oxycodone).   levothyroxine 50 MCG tablet Commonly known as: SYNTHROID Take 50 mcg by mouth daily before breakfast.   losartan-hydrochlorothiazide 100-25 MG tablet Commonly known as: HYZAAR Take 1 tablet by mouth  daily.   ondansetron 4 MG tablet Commonly known as: ZOFRAN Take 1 tablet (4 mg total) by mouth every 6 (six) hours as needed for nausea.   oxyCODONE 15 MG immediate release tablet Commonly known as: ROXICODONE Take 15 mg by mouth in the morning, at noon, in the evening, and at bedtime.   pantoprazole 20 MG tablet Commonly known as: PROTONIX Take 20 mg by mouth daily.   traZODone 50 MG tablet Commonly known as: DESYREL Take 50 mg by mouth at bedtime as needed for sleep.   triamcinolone 0.025 % ointment Commonly known as: KENALOG Apply 1 Application topically 2 (two) times daily.               Durable Medical Equipment  (From admission, onward)           Start     Ordered   08/20/23 1038  For home use only DME Walker rolling  Once       Question Answer Comment  Walker: With 5 Inch Wheels   Patient needs a walker to treat with the following condition S/P revision of total knee, left      08/20/23 1037   08/20/23 1037  For home use only DME 3 n 1  Once        08/20/23 1037              Signed: Verlee Rossetti 08/22/2023, 10:29 AM  [facility] is now [employer] [address], [place], [place] [address] Phone: [phone] Facebook  [employer]

## 2023-08-26 ENCOUNTER — Other Ambulatory Visit (HOSPITAL_COMMUNITY): Payer: Self-pay

## 2023-10-19 ENCOUNTER — Institutional Professional Consult (permissible substitution): Payer: Medicare PPO | Admitting: Pulmonary Disease

## 2023-10-19 ENCOUNTER — Encounter: Payer: Self-pay | Admitting: Pulmonary Disease

## 2023-12-16 ENCOUNTER — Encounter: Payer: Self-pay | Admitting: Pulmonary Disease

## 2023-12-16 ENCOUNTER — Ambulatory Visit (INDEPENDENT_AMBULATORY_CARE_PROVIDER_SITE_OTHER): Payer: Medicare PPO | Admitting: Pulmonary Disease

## 2023-12-16 VITALS — BP 131/84 | HR 95 | Temp 98.4°F | Ht 63.0 in | Wt 250.0 lb

## 2023-12-16 DIAGNOSIS — R0600 Dyspnea, unspecified: Secondary | ICD-10-CM

## 2023-12-16 DIAGNOSIS — R069 Unspecified abnormalities of breathing: Secondary | ICD-10-CM

## 2023-12-16 NOTE — Progress Notes (Signed)
 Brooke Ward    [id number]    [birth year]  Primary Care Physician:Williams, Jone Baseman, MD  Referring Physician: Jearld Lesch, MD [address] [place],  [place] [address]  Chief complaint: Consult for dyspnea  HPI: 58 y.o. who  has a past medical history of Anxiety, Arthritis, Asthma, Depression, GERD (gastroesophageal reflux disease), Hypertension, and Hypothyroidism.  Discussed the use of AI scribe software for clinical note transcription with the patient, who gave verbal consent to proceed.  History of Present Illness Milly Goggins is a 58 year old female who presents with breathing difficulties. She was referred by Dr. Lerry Liner for evaluation of her breathing issues.  In November, she experienced breathing difficulties, which she attributes to significant weight gain. Her weight increased to approximately 260 pounds at that time. Since then, she has lost weight, currently weighing around 250 pounds, and her breathing has improved significantly.  No history of smoking. No exposure to mold, hot tubs, or jacuzzis at home. She has a dog and uses a down pillow, which she has had for about a year.  No known family history of lung disease.  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  Pets: Occupation: Exposures: Smoking history: Travel history: Relevant family history:   Outpatient Encounter Medications as of 12/16/2023  Medication Sig   albuterol (VENTOLIN HFA) 108 (90 Base) MCG/ACT inhaler Inhale 2 puffs into the lungs every 6 (six) hours as needed for wheezing or shortness of breath.   ALPRAZolam (XANAX) 1 MG tablet Take 1 mg by mouth in the morning, at noon, and at bedtime.   amitriptyline (ELAVIL) 50 MG tablet Take 50 mg by mouth at bedtime.   amphetamine-dextroamphetamine (ADDERALL) 20 MG tablet Take 20-40 mg by mouth See admin instructions. Take 40 mg by mouth in the morning and take 20 mg in the afternoon.   atenolol  (TENORMIN) 25 MG tablet Take 25 mg by mouth daily.   cyclobenzaprine (FLEXERIL) 10 MG tablet Take 1 tablet (10 mg total) by mouth 3 (three) times daily as needed for muscle spasms.   docusate sodium (COLACE) 100 MG capsule Take 100 mg by mouth 2 (two) times daily as needed for mild constipation.   escitalopram (LEXAPRO) 10 MG tablet Take 10 mg by mouth daily.   fluticasone (FLONASE) 50 MCG/ACT nasal spray Place 1 spray into both nostrils daily as needed for allergies.   gabapentin (NEURONTIN) 300 MG capsule Take 1 capsule (300 mg total) by mouth as directed. Take a 300 mg capsule three times a day for two weeks following surgery.Then take a 300 mg capsule two times a day for two weeks. Then take a 300 mg capsule once a day for two weeks. Then discontinue.   levothyroxine (SYNTHROID) 50 MCG tablet Take 50 mcg by mouth daily before breakfast.   losartan-hydrochlorothiazide (HYZAAR) 100-25 MG tablet Take 1 tablet by mouth daily.   ondansetron (ZOFRAN) 4 MG tablet Take 1 tablet (4 mg total) by mouth every 6 (six) hours as needed for nausea.   oxyCODONE (ROXICODONE) 15 MG immediate release tablet Take 15 mg by mouth in the morning, at noon, in the evening, and at bedtime.   pantoprazole (PROTONIX) 20 MG tablet Take 20 mg by mouth daily.   traZODone (DESYREL) 50 MG tablet Take 50 mg by mouth at bedtime as needed for sleep.   triamcinolone (KENALOG) 0.025 % ointment Apply 1 Application topically 2 (two) times daily.  HYDROmorphone (DILAUDID) 2 MG tablet Take 1-2 tablets (2-4 mg total) by mouth every 6 (six) hours as needed (severe pain not controlled by chronic oxycodone). (Patient not taking: Reported on 12/16/2023)   No facility-administered encounter medications on file as of 12/16/2023.    Allergies as of 12/16/2023 - Review Complete 12/16/2023  Allergen Reaction Noted   Lisinopril Swelling 11/18/2021    Past Medical History:  Diagnosis Date   Anxiety    Arthritis    Asthma    Depression     GERD (gastroesophageal reflux disease)    Hypertension    Hypothyroidism     Past Surgical History:  Procedure Laterality Date   HERNIA REPAIR     KNEE ARTHROPLASTY Bilateral    TOTAL KNEE REVISION Left 08/19/2023   Procedure: TOTAL KNEE REVISION;  Surgeon: Ollen Gross, MD;  Location: [facility];  Service: Orthopedics;  Laterality: Left;   TOTAL SHOULDER ARTHROPLASTY Left 11/28/2021   Procedure: reverse shoulder arthroplasty;  Surgeon: Francena Hanly, MD;  Location: [facility];  Service: Orthopedics;  Laterality: Left;    WISDOM TOOTH EXTRACTION     WRIST SURGERY Left     No family history on file.  Social History   Socioeconomic History   Marital status: Single    Spouse name: Not on file   Number of children: Not on file   Years of education: Not on file   Highest education level: Not on file  Occupational History   Not on file  Tobacco Use   Smoking status: Never   Smokeless tobacco: Never  Vaping Use   Vaping status: Never Used  Substance and Sexual Activity   Alcohol use: Yes    Comment: occ   Drug use: Never   Sexual activity: Not on file  Other Topics Concern   Not on file  Social History Narrative   Not on file   Social Drivers of Health   Financial Resource Strain: Not on file  Food Insecurity: No Food Insecurity (08/19/2023)   Hunger Vital Sign    Worried About Running Out of Food in the Last Year: Never true    Ran Out of Food in the Last Year: Never true  Transportation Needs: No Transportation Needs (08/19/2023)   PRAPARE - Administrator, Civil Service (Medical): No    Lack of Transportation (Non-Medical): No  Physical Activity: Not on file  Stress: Not on file  Social Connections: Not on file  Intimate Partner Violence: Not At Risk (08/19/2023)   Humiliation, Afraid, Rape, and Kick questionnaire    Fear of Current or Ex-Partner: No    Emotionally Abused: No    Physically Abused: No    Sexually Abused: No    Review of  systems: Review of Systems  Constitutional: Negative for fever and chills.  HENT: Negative.   Eyes: Negative for blurred vision.  Respiratory: as per HPI  Cardiovascular: Negative for chest pain and palpitations.  Gastrointestinal: Negative for vomiting, diarrhea, blood per rectum. Genitourinary: Negative for dysuria, urgency, frequency and hematuria.  Musculoskeletal: Negative for myalgias, back pain and joint pain.  Skin: Negative for itching and rash.  Neurological: Negative for dizziness, tremors, focal weakness, seizures and loss of consciousness.  Endo/Heme/Allergies: Negative for environmental allergies.  Psychiatric/Behavioral: Negative for depression, suicidal ideas and hallucinations.  All other systems reviewed and are negative.  Physical Exam: Blood pressure 131/84, pulse 95, temperature 98.4 F (36.9 C), height 5\' 3"  (1.6 m), weight 250 lb (113.4 kg), SpO2  96%. Gen:      No acute distress HEENT:  EOMI, sclera anicteric Neck:     No masses; no thyromegaly Lungs:    Clear to auscultation bilaterally; normal respiratory effort*** CV:         Regular rate and rhythm; no murmurs Abd:      + bowel sounds; soft, non-tender; no palpable masses, no distension Ext:    No edema; adequate peripheral perfusion Skin:      Warm and dry; no rash Neuro: alert and oriented x 3 Psych: normal mood and affect  Data Reviewed: Imaging:  PFTs:  Labs:  Assessment and Plan Assessment & Plan Dyspnea Experienced dyspnea in November, likely due to weight gain, reaching 260 pounds. Weight loss to 250 pounds has improved breathing. No current evidence of pulmonary disease. Reports feeling normal. Declines further diagnostic testing. - No further action required as symptoms resolved. - Advise follow-up with primary care physician, Dr. Lerry Liner, if symptoms recur or further evaluation is needed.      Recommendations: *** This appointment required *** minutes of patient care (this  includes precharting, chart review, review of results, face-to-face care, etc.).  Chilton Greathouse MD [facility] 12/16/2023, 3:22 PM  CC: Jearld Lesch, MD

## 2023-12-16 NOTE — Patient Instructions (Signed)
 VISIT SUMMARY:  Today, you were seen for breathing difficulties that you experienced in November. You mentioned that your breathing issues have improved significantly after losing some weight. You have no history of smoking or exposure to harmful substances, and there is no family history of lung disease. You are currently feeling normal and have declined further diagnostic testing.  YOUR PLAN:  -DYSPNEA: Dyspnea means difficulty breathing. Your breathing issues in November were likely due to weight gain, but since losing weight, your symptoms have improved. There is no current evidence of lung disease, and you are feeling normal now. No further action is required at this time, but you should follow up with your primary care physician, Dr. Lerry Liner, if your symptoms return or if you need further evaluation.  INSTRUCTIONS:  Please follow up with your primary care physician, Dr. Lerry Liner, if your breathing difficulties return or if you need further evaluation.

## 2024-03-28 ENCOUNTER — Ambulatory Visit (INDEPENDENT_AMBULATORY_CARE_PROVIDER_SITE_OTHER): Admitting: Podiatry

## 2024-03-28 ENCOUNTER — Encounter: Payer: Self-pay | Admitting: Podiatry

## 2024-03-28 DIAGNOSIS — B351 Tinea unguium: Secondary | ICD-10-CM

## 2024-03-28 DIAGNOSIS — M79674 Pain in right toe(s): Secondary | ICD-10-CM | POA: Diagnosis not present

## 2024-03-28 DIAGNOSIS — M2042 Other hammer toe(s) (acquired), left foot: Secondary | ICD-10-CM | POA: Diagnosis not present

## 2024-03-28 DIAGNOSIS — M2041 Other hammer toe(s) (acquired), right foot: Secondary | ICD-10-CM

## 2024-03-28 DIAGNOSIS — M79675 Pain in left toe(s): Secondary | ICD-10-CM | POA: Diagnosis not present

## 2024-03-28 DIAGNOSIS — L84 Corns and callosities: Secondary | ICD-10-CM

## 2024-03-28 NOTE — Progress Notes (Unsigned)
 Chief Complaint  Patient presents with   Callouses    Corns between right 4th & 5th. Also would like left 2nd trimmed as she has been unable to do so. Not diabetic. Takes asa 81 mg.     HPI: 58 y.o. female presents today with primary concern for painful interdigital callus between the right 4th and 5th toes.  She does also complain of dystrophic toenails mostly affecting the 2nd and 3rd toes bilaterally.  All these areas do become painful for the patient and she gets associated calluses with them.  She denies diabetes.  Past Medical History:  Diagnosis Date   Anxiety    Arthritis    Asthma    Depression    GERD (gastroesophageal reflux disease)    Hypertension    Hypothyroidism     Past Surgical History:  Procedure Laterality Date   HERNIA REPAIR     KNEE ARTHROPLASTY Bilateral    TOTAL KNEE REVISION Left 08/19/2023   Procedure: TOTAL KNEE REVISION;  Surgeon: Melodi Lerner, MD;  Location: [facility];  Service: Orthopedics;  Laterality: Left;   TOTAL SHOULDER ARTHROPLASTY Left 11/28/2021   Procedure: reverse shoulder arthroplasty;  Surgeon: Melita Drivers, MD;  Location: [facility];  Service: Orthopedics;  Laterality: Left;    WISDOM TOOTH EXTRACTION     WRIST SURGERY Left     Allergies  Allergen Reactions   Lisinopril Swelling    Throat swelling    ROS    Physical Exam: There were no vitals filed for this visit.  General: The patient is alert and oriented x3 in no acute distress.  Dermatology: Interdigital callus present right fourth toe lateral aspect.  There is also callus associated with nail dystrophy to the distal 2nd and 3rd toes bilaterally.  These toenails are also discolored, thickened, mycotic in appearance.  They are tender on direct dorsal palpation.  Remaining toenails are normal atrophic.  Vascular: Palpable pedal pulses bilaterally. Capillary refill within normal limits.  No appreciable edema.  No erythema or calor.  Neurological: Light touch  sensation grossly intact bilateral feet.   Musculoskeletal Exam: Mild hammertoe contractures of lesser digits noted.  These are reducible  Assessment/Plan of Care: 1. Acquired hammertoes of both feet   2. Pain due to onychomycosis of toenails of both feet   3. Callus between toes      No orders of the defined types were placed in this encounter.  None  Discussed clinical findings with patient today.  # Hammertoes of both the # Associated callus right fourth toe -Preulcerative callus was debrided as a courtesy today without incident using 312 scalpel blade. - Discussed etiology of hammertoe contractures and conservative versus surgical treatment options. - Conservative treatment options include shoe gear modification, NSAIDs, various padding devices.  Toe spacer was dispensed today. - Briefly discussed surgical treatment as well, we will proceed with conservative treatment. - X-rays at follow-up if this is ongoing.  #Onychomycosis with pain  -Nails palliatively debrided as below. -Mostly affecting the bilateral 2nd and 3rd toenails -Educated on self-care  Procedure: Nail Debridement Rationale: Pain Type of Debridement: manual, sharp debridement. Instrumentation: Nail nipper, rotary burr. Number of Nails: 4  Follow-up in 3 months for assistance with managing the dystrophic mycotic toenails, can follow-up as needed for hammertoes.   Ellinor Test L. Lamount MAUL, AACFAS [facility]     [address]  [place], [place] [address]                Office [phone]  Fax [phone]

## 2024-12-22 ENCOUNTER — Ambulatory Visit: Admitting: Neurology
